# Patient Record
Sex: Female | Born: 1955 | Race: White | Hispanic: No | Smoking: Current every day smoker
Health system: Southern US, Community
[De-identification: ages and names within clinical notes are randomized; demographics above are authoritative.]

## PROBLEM LIST (undated history)

## (undated) DIAGNOSIS — I739 Peripheral vascular disease, unspecified: Secondary | ICD-10-CM

## (undated) DIAGNOSIS — M1990 Unspecified osteoarthritis, unspecified site: Secondary | ICD-10-CM

## (undated) DIAGNOSIS — J449 Chronic obstructive pulmonary disease, unspecified: Secondary | ICD-10-CM

## (undated) DIAGNOSIS — I10 Essential (primary) hypertension: Secondary | ICD-10-CM

## (undated) DIAGNOSIS — B1920 Unspecified viral hepatitis C without hepatic coma: Secondary | ICD-10-CM

## (undated) DIAGNOSIS — E785 Hyperlipidemia, unspecified: Secondary | ICD-10-CM

## (undated) DIAGNOSIS — I219 Acute myocardial infarction, unspecified: Secondary | ICD-10-CM

## (undated) HISTORY — PX: TONSILLECTOMY: SUR1361

## (undated) HISTORY — PX: ABDOMINAL HYSTERECTOMY: SHX81

## (undated) HISTORY — DX: Hyperlipidemia, unspecified: E78.5

## (undated) HISTORY — DX: Essential (primary) hypertension: I10

## (undated) HISTORY — PX: COLON RESECTION: SHX5231

## (undated) HISTORY — DX: Chronic obstructive pulmonary disease, unspecified: J44.9

## (undated) HISTORY — PX: FRACTURE SURGERY: SHX138

## (undated) HISTORY — DX: Peripheral vascular disease, unspecified: I73.9

## (undated) HISTORY — DX: Acute myocardial infarction, unspecified: I21.9

---

## 2002-09-18 HISTORY — PX: MANDIBLE FRACTURE SURGERY: SHX706

## 2002-09-18 HISTORY — PX: HIP FRACTURE SURGERY: SHX118

## 2002-09-18 HISTORY — PX: FACIAL FRACTURE SURGERY: SHX1570

## 2014-09-15 ENCOUNTER — Other Ambulatory Visit: Payer: Self-pay | Admitting: Family Medicine

## 2014-09-15 DIAGNOSIS — R1011 Right upper quadrant pain: Secondary | ICD-10-CM

## 2014-09-21 ENCOUNTER — Ambulatory Visit
Admission: RE | Admit: 2014-09-21 | Discharge: 2014-09-21 | Disposition: A | Discharge status: Home / Self Care | Payer: PRIVATE HEALTH INSURANCE | Source: Ambulatory Visit | Attending: Family Medicine | Admitting: Family Medicine

## 2014-09-21 DIAGNOSIS — R1011 Right upper quadrant pain: Secondary | ICD-10-CM

## 2016-07-28 IMAGING — US US ABDOMEN LIMITED
1 series · 13 of 25 positions shown · non-contrast
Comparison: None.

CLINICAL DATA: Right upper quadrant abdominal pain for 1 day.

EXAM:
US ABDOMEN LIMITED - RIGHT UPPER QUADRANT

[Series 1: us abdomen limited · 0.17mm/px · 13 of 59 slices shown]
[im 1/59]
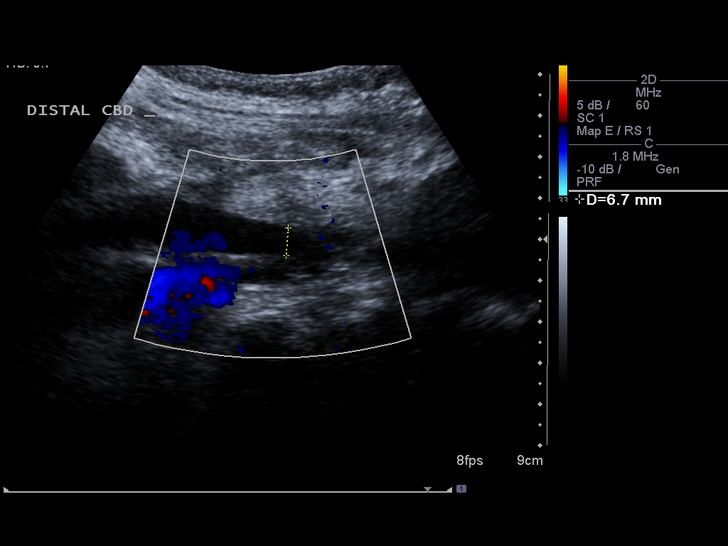
[im 5/59]
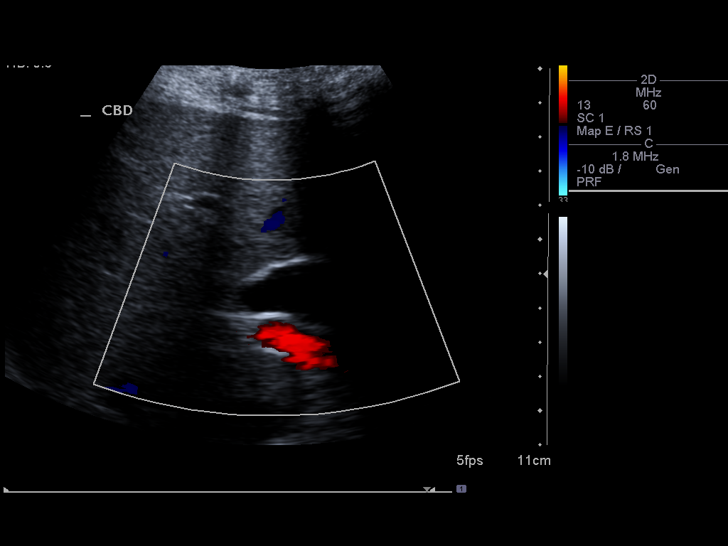
[im 10/59]
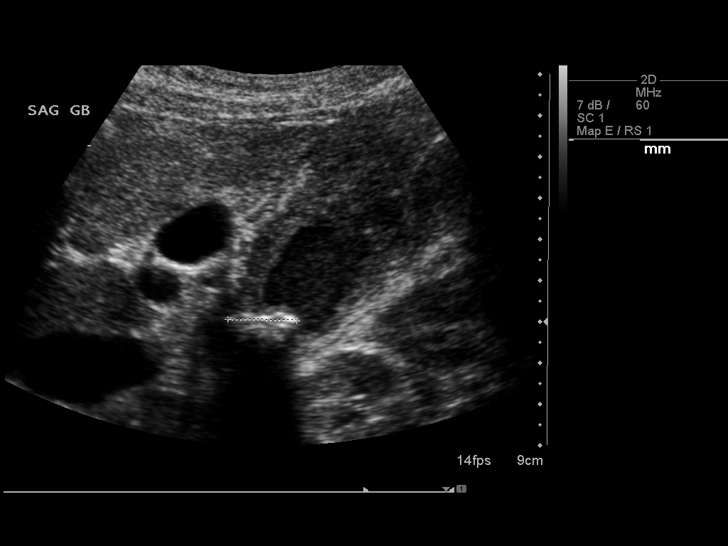
[im 15/59]
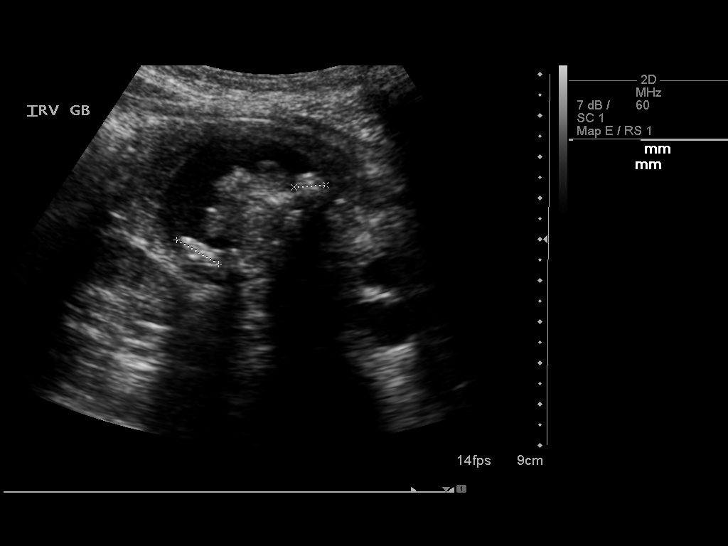
[im 20/59]
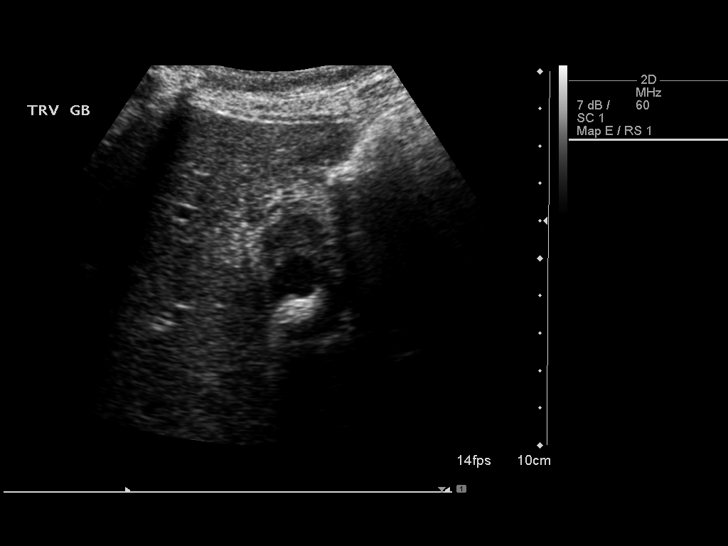
[im 25/59]
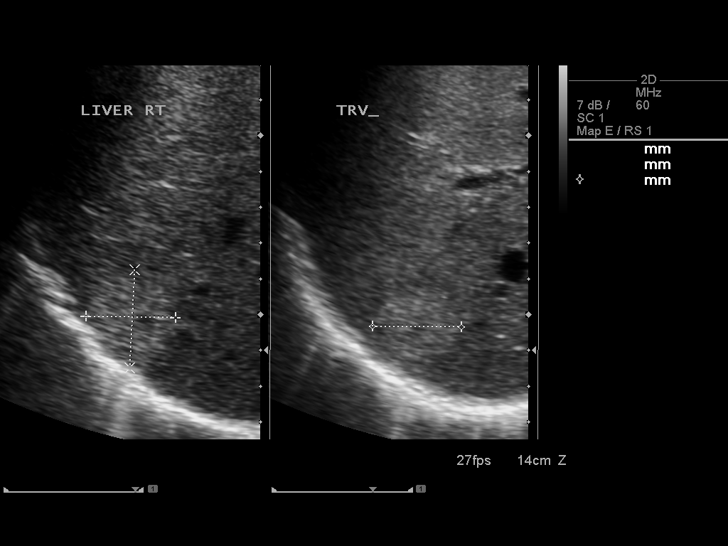
[im 30/59]
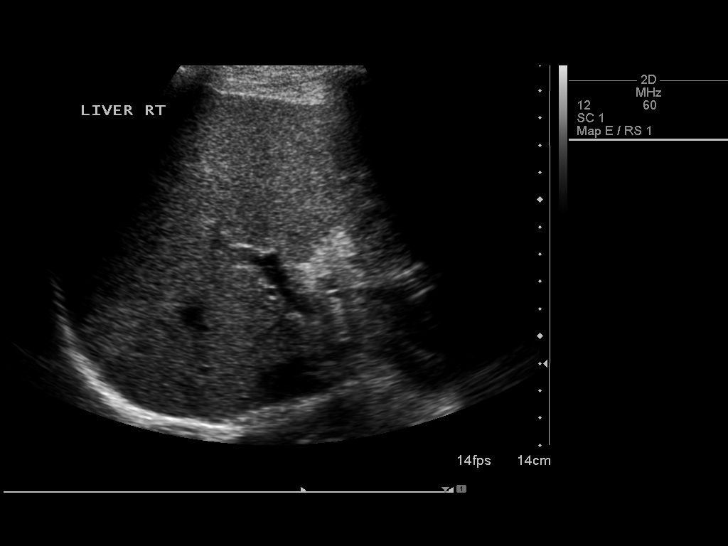
[im 34/59]
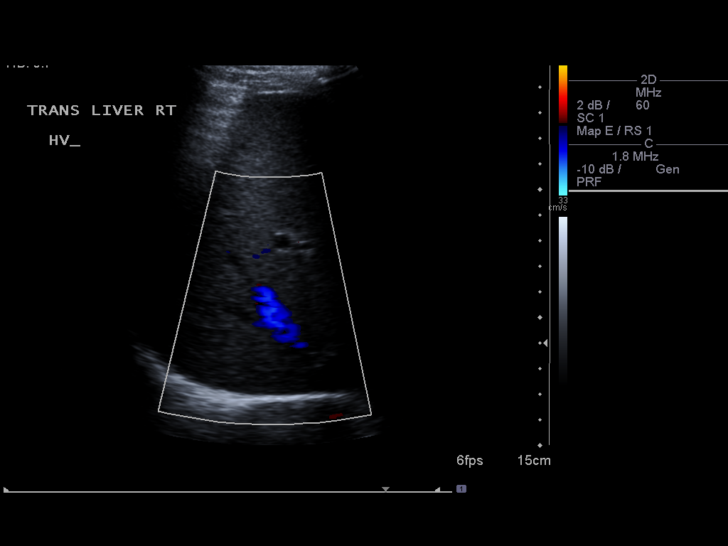
[im 39/59]
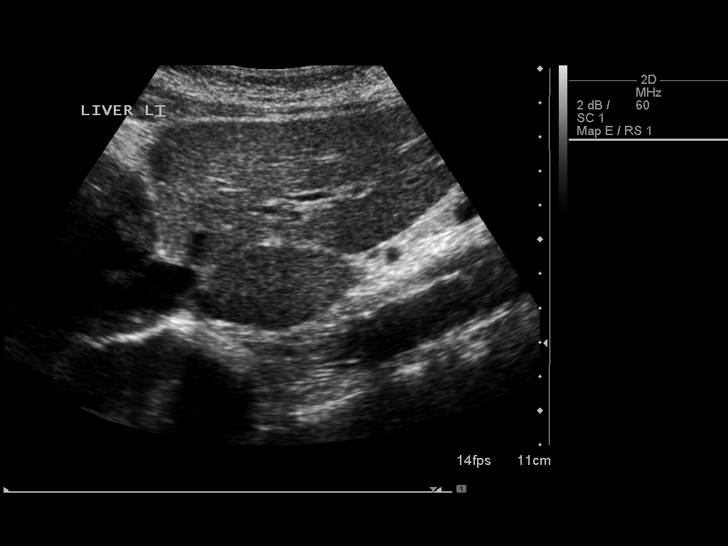
[im 44/59]
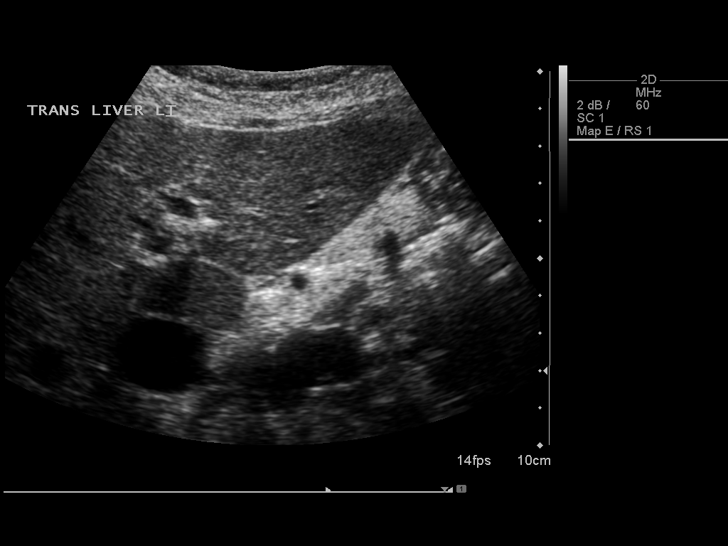
[im 49/59]
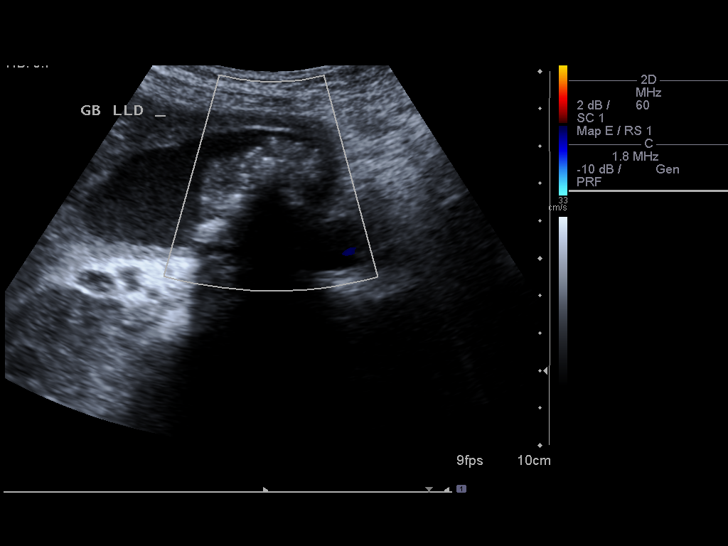
[im 54/59]
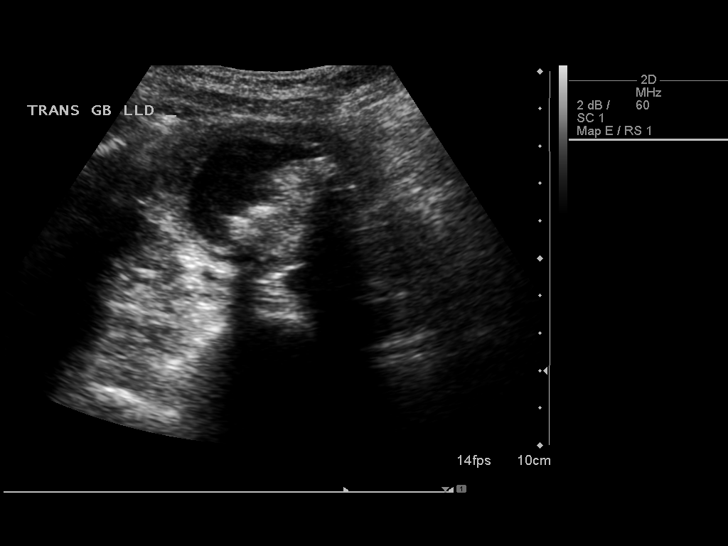
[im 59/59]
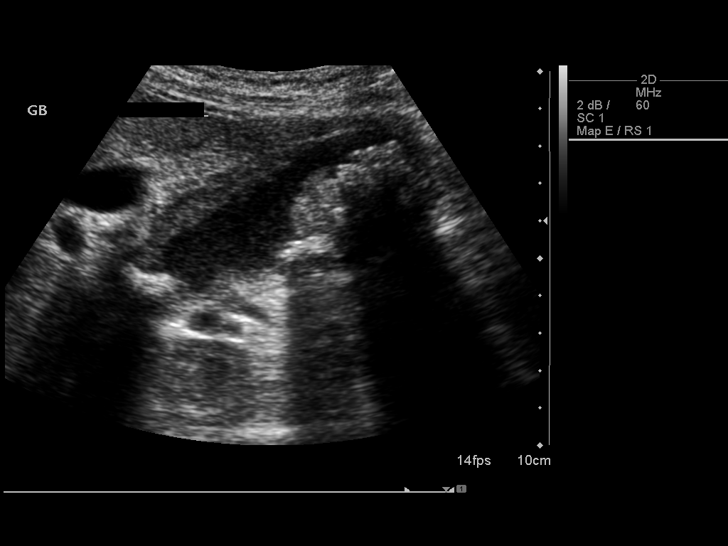

[13 of 25 positions shown; findings below may reference images not displayed]

FINDINGS: Gallbladder:

Multiple gallstones are noted with the largest measuring 1.7 cm in
the neck of the gallbladder. Significant gallbladder wall thickening
is noted measuring 11 mm, but no definite pericholecystic fluid is
noted. No sonographic Murphy's sign is noted.

Common bile duct:

Diameter: Measures 15 mm distally which is concerning for distal
common bile duct obstruction. Correlation with liver function tests
is recommended.

Liver:

2.7 cm echogenic focus is noted peripherally in the right hepatic
lobe most consistent with hemangioma.
IMPRESSION: Cholelithiasis is noted with significant gallbladder wall
thickening, with the largest stone in neck of gallbladder. No
sonographic Murphy's sign is noted, but these findings are
concerning for acute cholecystitis. Clinical correlation and HIDA
scan may be performed for further evaluation.

Dilated distal common bile duct is noted which is concerning for
distal common bile duct obstruction. Correlation with liver function
tests is recommended.

2.7 cm echogenic focus noted peripherally in right hepatic lobe most
consistent with hemangioma in the absence of any history of
malignancy. Followup ultrasound in 6 months is recommended to ensure
stability. If the patient does have a history of malignancy, then
metastatic disease cannot be excluded and further evaluation with
MRI would be recommended in that situation.

These results will be called to the ordering clinician or
representative by the Radiologist Assistant, and communication
documented in the PACS or zVision Dashboard.

## 2016-11-17 ENCOUNTER — Other Ambulatory Visit (HOSPITAL_COMMUNITY): Payer: Self-pay | Admitting: Gastroenterology

## 2016-11-17 DIAGNOSIS — B1920 Unspecified viral hepatitis C without hepatic coma: Secondary | ICD-10-CM

## 2016-12-14 ENCOUNTER — Ambulatory Visit (HOSPITAL_COMMUNITY): Payer: 59

## 2017-01-09 ENCOUNTER — Other Ambulatory Visit (HOSPITAL_COMMUNITY): Payer: PRIVATE HEALTH INSURANCE

## 2017-01-11 ENCOUNTER — Ambulatory Visit (HOSPITAL_COMMUNITY)
Admission: RE | Admit: 2017-01-11 | Discharge: 2017-01-11 | Disposition: A | Discharge status: Home / Self Care | Payer: 59 | Source: Ambulatory Visit | Attending: Gastroenterology | Admitting: Gastroenterology

## 2017-01-11 DIAGNOSIS — K8020 Calculus of gallbladder without cholecystitis without obstruction: Secondary | ICD-10-CM | POA: Insufficient documentation

## 2017-01-11 DIAGNOSIS — K838 Other specified diseases of biliary tract: Secondary | ICD-10-CM | POA: Insufficient documentation

## 2017-01-11 DIAGNOSIS — B1920 Unspecified viral hepatitis C without hepatic coma: Secondary | ICD-10-CM | POA: Diagnosis present

## 2017-01-11 DIAGNOSIS — D1803 Hemangioma of intra-abdominal structures: Secondary | ICD-10-CM | POA: Insufficient documentation

## 2017-07-21 IMAGING — US US ABDOMEN COMPLETE W/ ELASTOGRAPHY
1 series · 13 of 15 positions shown · non-contrast
Comparison: None.

CLINICAL DATA: Hepatitis-C



[Series 1: us abdomen complete w/ elastography · 0.15mm/px · 13 of 15 slices shown]
[im 1/15]
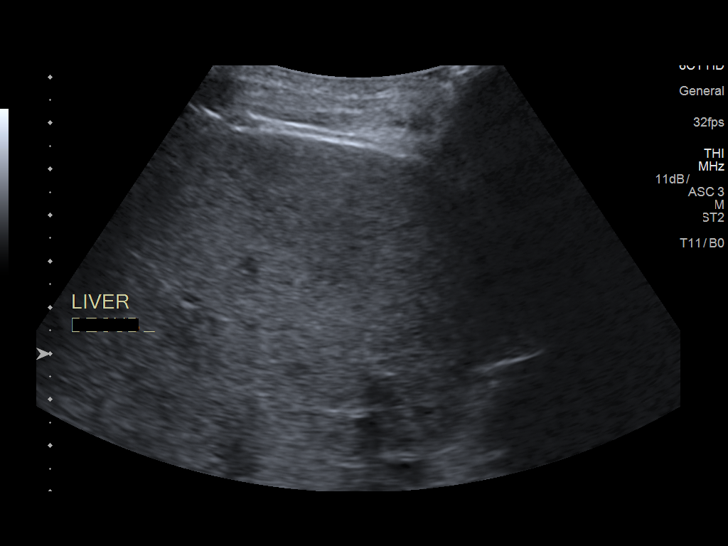
[im 2/15]
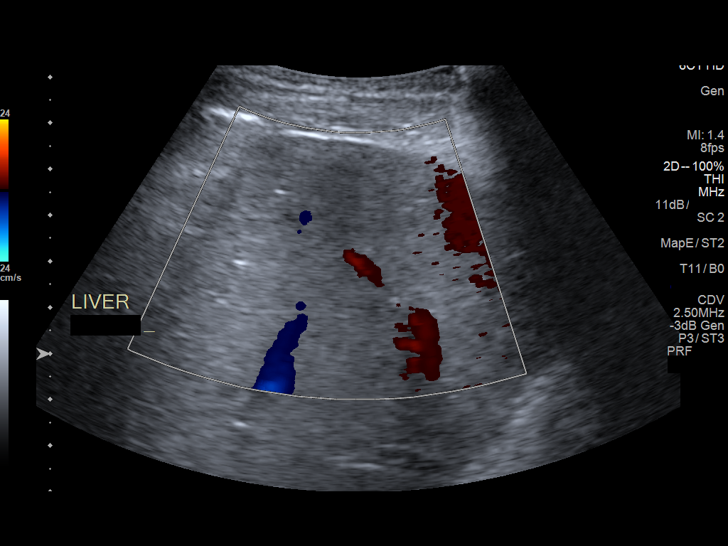
[im 3/15]
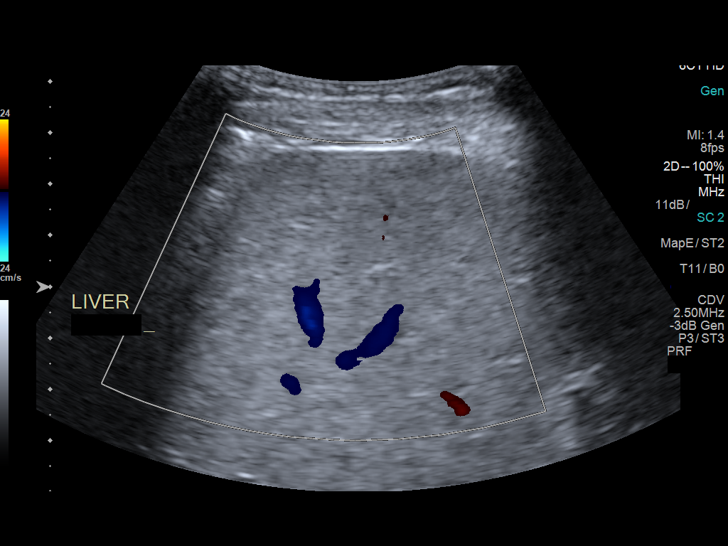
[im 5/15]
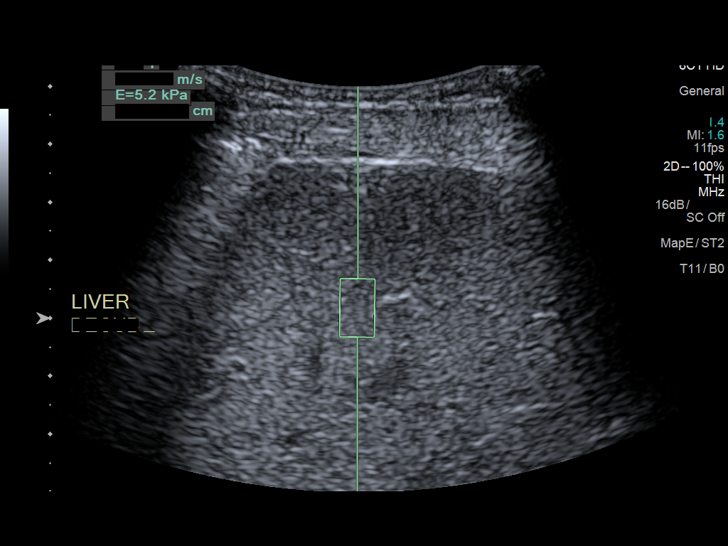
[im 6/15]
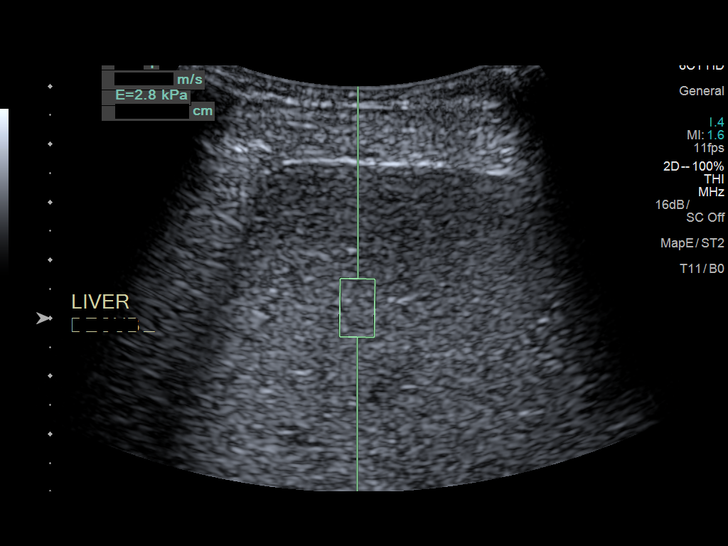
[im 7/15]
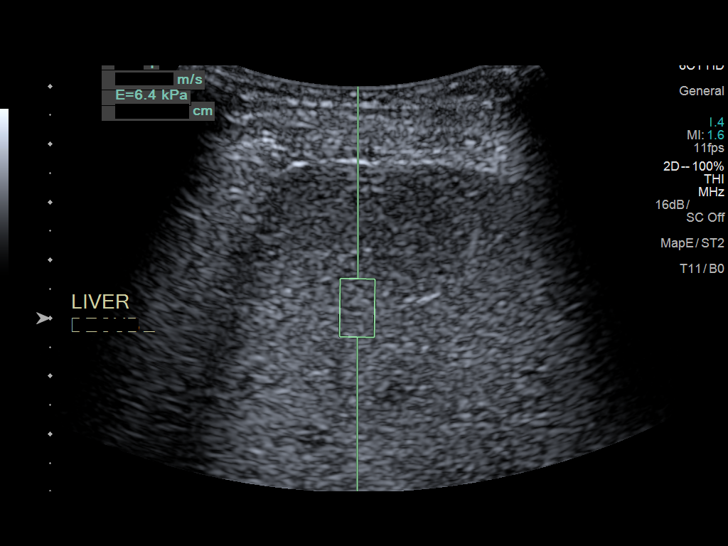
[im 8/15]
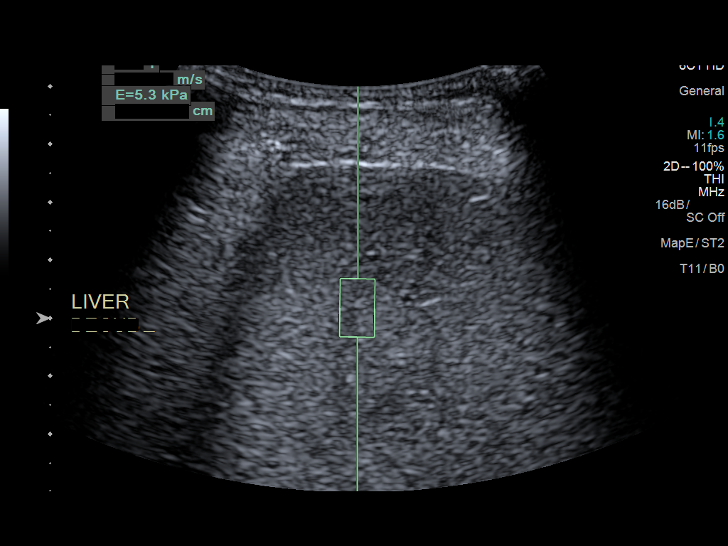
[im 9/15]
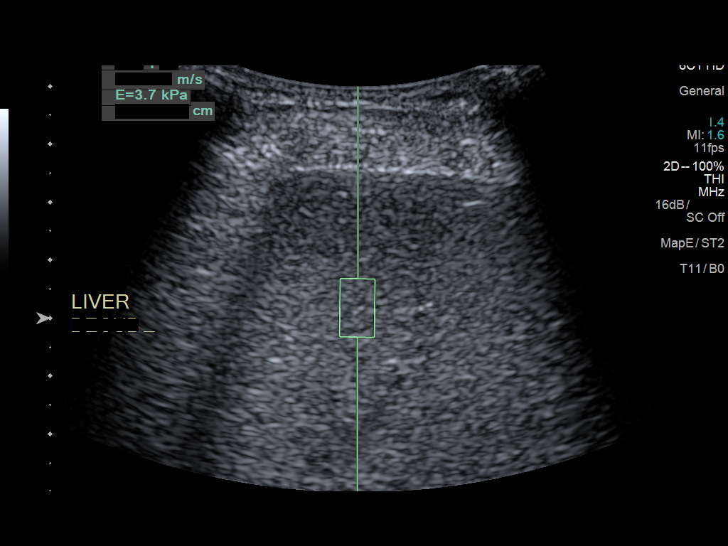
[im 10/15]
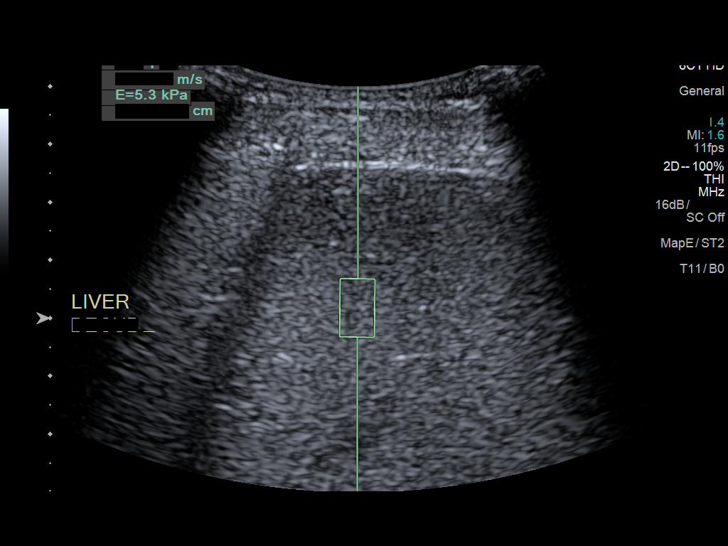
[im 11/15]
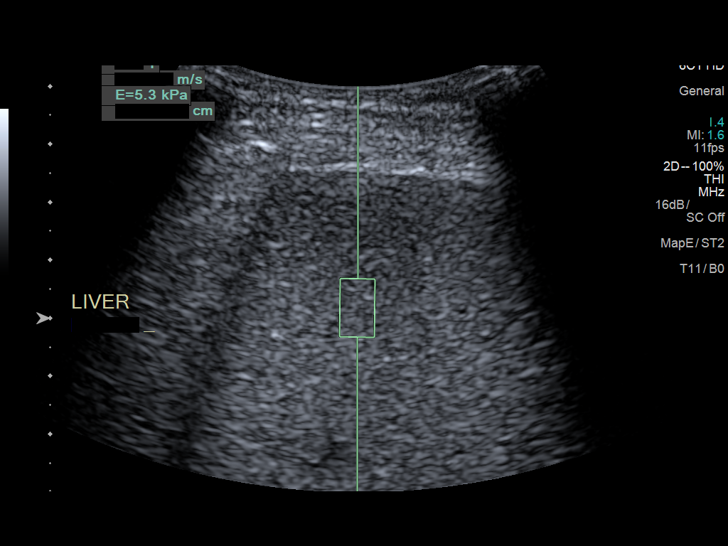
[im 13/15]
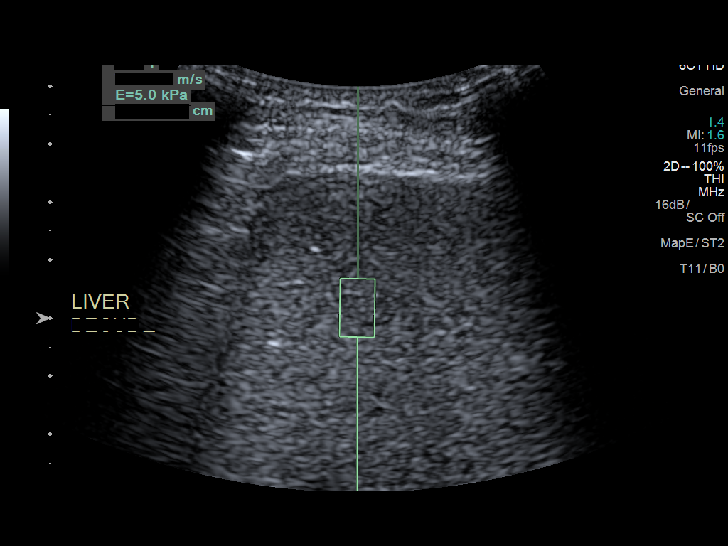
[im 14/15]
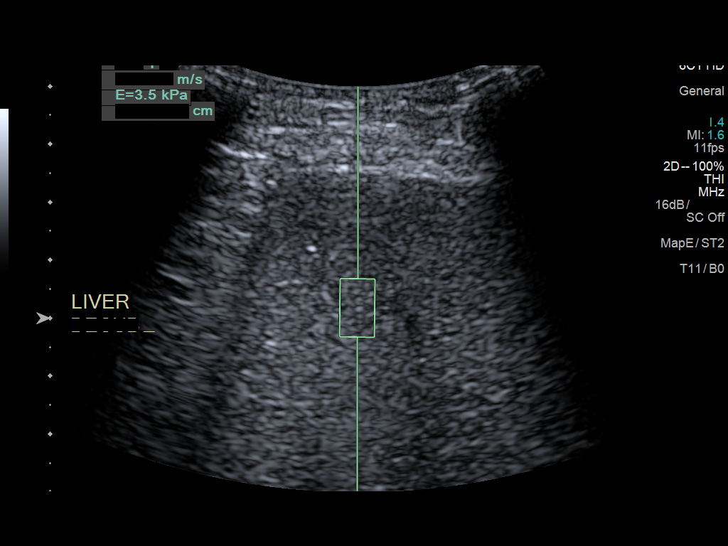
[im 15/15]
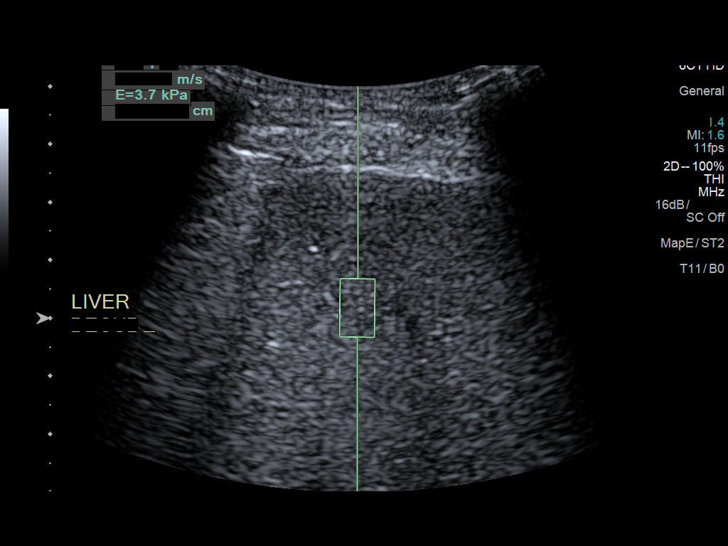

[13 of 15 positions shown; findings below may reference images not displayed]

FINDINGS: ULTRASOUND ABDOMEN

Gallbladder: Numerous gallstones fill the gallbladder. Gallbladder
wall is thickened at 4 mm. Negative sonographic Ofori. This is
unchanged since prior study.

Common bile duct: Diameter: Dilated, 12 mm in greatest diameter. The
distal duct is obscured by overlying bowel gas. This is unchanged
since prior study.

Liver: 2.7 cm echogenic area posteriorly in the liver compatible
with hemangioma, stable.

IVC: No abnormality visualized.

Pancreas: Visualized portion unremarkable.

Spleen: Size and appearance within normal limits.

Right Kidney: Length: 11.2 cm. Echogenicity within normal limits. No
mass or hydronephrosis visualized.

Left Kidney: Length: 12.0 cm. Echogenicity within normal limits. No
mass or hydronephrosis visualized.

Abdominal aorta: No aneurysm visualized.

Other findings: None.

ULTRASOUND HEPATIC ELASTOGRAPHY

Device: Siemens Helix VTQ

Patient position: Left lateral decubitus

Transducer [id number]

Number of measurements: 10

Hepatic segment:  8

Median velocity:   1.31  m/sec

IQR:

IQR/Median velocity ratio:

Corresponding Metavir fibrosis score:  F2 + some F3

Risk of fibrosis: Moderate

Limitations of exam: None

Pertinent findings noted on other imaging exams:  None

Please note that abnormal shear wave velocities may also be
identified in clinical settings other than with hepatic fibrosis,
such as: acute hepatitis, elevated right heart and central venous
pressures including use of beta blockers, Koshy disease
([place]), infiltrative processes such as
mastocytosis/amyloidosis/infiltrative tumor, extrahepatic
cholestasis, in the post-prandial state, and liver transplantation.
Correlation with patient history, laboratory data, and clinical
condition recommended.
IMPRESSION: ULTRASOUND ABDOMEN:
Cholelithiasis with gallbladder wall thickening. The appearance is
similar to prior study. Dilated common bile duct measuring up to 12
mm, also stable since prior study.

Hemangioma in the posterior right hepatic lobe measures 2.7 cm,
stable.

ULTRASOUND HEPATIC ELASTOGRAPHY:

Median hepatic shear wave velocity is calculated at 1.31 m/sec.

Corresponding Metavir fibrosis score is  F2 + some F3.

Risk of fibrosis is Moderate.

Follow-up: Additional testing appropriate

## 2017-10-12 ENCOUNTER — Encounter (HOSPITAL_COMMUNITY): Payer: Self-pay | Admitting: General Practice

## 2017-10-12 ENCOUNTER — Ambulatory Visit: Payer: 59 | Admitting: Cardiovascular Disease

## 2017-10-12 ENCOUNTER — Inpatient Hospital Stay (HOSPITAL_COMMUNITY)
Admission: RE | Disposition: A | Discharge status: Home / Self Care | Payer: Self-pay | Source: Ambulatory Visit | Attending: Cardiovascular Disease

## 2017-10-12 ENCOUNTER — Encounter: Payer: Self-pay | Admitting: Cardiovascular Disease

## 2017-10-12 ENCOUNTER — Other Ambulatory Visit: Payer: Self-pay

## 2017-10-12 ENCOUNTER — Inpatient Hospital Stay (HOSPITAL_COMMUNITY)
Admission: RE | Admit: 2017-10-12 | Discharge: 2017-10-13 | DRG: 247 | Disposition: A | Discharge status: Home / Self Care | Payer: 59 | Source: Ambulatory Visit | Attending: Cardiovascular Disease | Admitting: Cardiovascular Disease

## 2017-10-12 VITALS — BP 136/86 | HR 75 | Ht 64.0 in | Wt 104.0 lb

## 2017-10-12 DIAGNOSIS — Z681 Body mass index (BMI) 19 or less, adult: Secondary | ICD-10-CM

## 2017-10-12 DIAGNOSIS — I10 Essential (primary) hypertension: Secondary | ICD-10-CM | POA: Insufficient documentation

## 2017-10-12 DIAGNOSIS — R636 Underweight: Secondary | ICD-10-CM | POA: Diagnosis present

## 2017-10-12 DIAGNOSIS — I2510 Atherosclerotic heart disease of native coronary artery without angina pectoris: Secondary | ICD-10-CM | POA: Diagnosis present

## 2017-10-12 DIAGNOSIS — S61012D Laceration without foreign body of left thumb without damage to nail, subsequent encounter: Secondary | ICD-10-CM

## 2017-10-12 DIAGNOSIS — I2584 Coronary atherosclerosis due to calcified coronary lesion: Secondary | ICD-10-CM | POA: Diagnosis present

## 2017-10-12 DIAGNOSIS — R072 Precordial pain: Secondary | ICD-10-CM

## 2017-10-12 DIAGNOSIS — Z8249 Family history of ischemic heart disease and other diseases of the circulatory system: Secondary | ICD-10-CM | POA: Diagnosis not present

## 2017-10-12 DIAGNOSIS — W269XXD Contact with unspecified sharp object(s), subsequent encounter: Secondary | ICD-10-CM | POA: Diagnosis not present

## 2017-10-12 DIAGNOSIS — Z79899 Other long term (current) drug therapy: Secondary | ICD-10-CM

## 2017-10-12 DIAGNOSIS — I214 Non-ST elevation (NSTEMI) myocardial infarction: Principal | ICD-10-CM | POA: Diagnosis present

## 2017-10-12 DIAGNOSIS — Z720 Tobacco use: Secondary | ICD-10-CM | POA: Insufficient documentation

## 2017-10-12 DIAGNOSIS — Z716 Tobacco abuse counseling: Secondary | ICD-10-CM

## 2017-10-12 DIAGNOSIS — F17210 Nicotine dependence, cigarettes, uncomplicated: Secondary | ICD-10-CM | POA: Diagnosis present

## 2017-10-12 DIAGNOSIS — E785 Hyperlipidemia, unspecified: Secondary | ICD-10-CM

## 2017-10-12 HISTORY — PX: CORONARY STENT INTERVENTION: CATH118234

## 2017-10-12 HISTORY — DX: Unspecified viral hepatitis C without hepatic coma: B19.20

## 2017-10-12 HISTORY — DX: Hyperlipidemia, unspecified: E78.5

## 2017-10-12 HISTORY — DX: Unspecified osteoarthritis, unspecified site: M19.90

## 2017-10-12 HISTORY — PX: LEFT HEART CATH AND CORONARY ANGIOGRAPHY: CATH118249

## 2017-10-12 HISTORY — PX: CORONARY ATHERECTOMY: CATH118238

## 2017-10-12 HISTORY — PX: INTRAVASCULAR ULTRASOUND/IVUS: CATH118244

## 2017-10-12 HISTORY — PX: CORONARY ANGIOPLASTY WITH STENT PLACEMENT: SHX49

## 2017-10-12 LAB — BASIC METABOLIC PANEL
ANION GAP: 15 (ref 5–15)
BUN: 11 mg/dL (ref 6–20)
CHLORIDE: 101 mmol/L (ref 101–111)
CO2: 22 mmol/L (ref 22–32)
Calcium: 9.4 mg/dL (ref 8.9–10.3)
Creatinine, Ser: 0.83 mg/dL (ref 0.44–1.00)
Glucose, Bld: 94 mg/dL (ref 65–99)
POTASSIUM: 3.5 mmol/L (ref 3.5–5.1)
SODIUM: 138 mmol/L (ref 135–145)

## 2017-10-12 LAB — POCT ACTIVATED CLOTTING TIME
ACTIVATED CLOTTING TIME: 268 s
ACTIVATED CLOTTING TIME: 0 s
Activated Clotting Time: 554 seconds

## 2017-10-12 LAB — CBC
HEMATOCRIT: 44.1 % (ref 36.0–46.0)
HEMOGLOBIN: 14.8 g/dL (ref 12.0–15.0)
MCH: 30.2 pg (ref 26.0–34.0)
MCHC: 33.6 g/dL (ref 30.0–36.0)
MCV: 90 fL (ref 78.0–100.0)
Platelets: 234 10*3/uL (ref 150–400)
RBC: 4.9 MIL/uL (ref 3.87–5.11)
RDW: 12.9 % (ref 11.5–15.5)
WBC: 8.2 10*3/uL (ref 4.0–10.5)

## 2017-10-12 LAB — LIPID PANEL
CHOLESTEROL: 188 mg/dL (ref 0–200)
HDL: 63 mg/dL (ref >40)
LDL Cholesterol: 111 mg/dL — ABNORMAL HIGH (ref 0–99)
Total CHOL/HDL Ratio: 3 RATIO
Triglycerides: 69 mg/dL (ref <150)
VLDL: 14 mg/dL (ref 0–40)

## 2017-10-12 LAB — TROPONIN I
TROPONIN I: 0.53 ng/mL — AB (ref <0.03)
Troponin I: 0.19 ng/mL (ref <0.03)
Troponin I: 0.23 ng/mL (ref <0.03)

## 2017-10-12 LAB — PROTIME-INR
INR: 1.15
Prothrombin Time: 14.7 seconds (ref 11.4–15.2)

## 2017-10-12 SURGERY — LEFT HEART CATH AND CORONARY ANGIOGRAPHY
Anesthesia: LOCAL

## 2017-10-12 MED ORDER — LIDOCAINE HCL (PF) 1 % IJ SOLN
INTRAMUSCULAR | Status: DC | PRN
  Administered 2017-10-12: 2 mL

## 2017-10-12 MED ORDER — MIDAZOLAM HCL 2 MG/2ML IJ SOLN
INTRAMUSCULAR | Status: DC | PRN
  Administered 2017-10-12 (×2): 1 mg via INTRAVENOUS

## 2017-10-12 MED ORDER — HEPARIN (PORCINE) IN NACL 2-0.9 UNIT/ML-% IJ SOLN
INTRAMUSCULAR | Status: AC
  Filled 2017-10-12: qty 500

## 2017-10-12 MED ORDER — SODIUM CHLORIDE 0.9% FLUSH: 3.0000 mL | INTRAVENOUS | Status: DC | PRN

## 2017-10-12 MED ORDER — ACTIVE PARTNERSHIP FOR HEALTH OF YOUR HEART BOOK
Freq: Once | Status: DC
  Filled 2017-10-12 (×2): qty 1

## 2017-10-12 MED ORDER — DOPAMINE-DEXTROSE 3.2-5 MG/ML-% IV SOLN
INTRAVENOUS | Status: AC
  Filled 2017-10-12: qty 250

## 2017-10-12 MED ORDER — FENTANYL CITRATE (PF) 100 MCG/2ML IJ SOLN
INTRAMUSCULAR | Status: AC
  Filled 2017-10-12: qty 2

## 2017-10-12 MED ORDER — TICAGRELOR 90 MG PO TABS
ORAL_TABLET | ORAL | Status: DC | PRN
  Administered 2017-10-12: 180 mg via ORAL

## 2017-10-12 MED ORDER — ATROPINE SULFATE 1 MG/10ML IJ SOSY
PREFILLED_SYRINGE | INTRAMUSCULAR | Status: DC | PRN
  Administered 2017-10-12: 0.5 mg via INTRAVENOUS

## 2017-10-12 MED ORDER — ACETAMINOPHEN 325 MG PO TABS: 650.0000 mg | ORAL_TABLET | ORAL | Status: DC | PRN

## 2017-10-12 MED ORDER — THE SENSUOUS HEART BOOK
Freq: Once | Status: AC
  Administered 2017-10-13
  Filled 2017-10-12: qty 1

## 2017-10-12 MED ORDER — LISINOPRIL 10 MG PO TABS: 10.0000 mg | ORAL_TABLET | Freq: Every day | ORAL | Status: DC

## 2017-10-12 MED ORDER — NITROGLYCERIN IN D5W 200-5 MCG/ML-% IV SOLN
INTRAVENOUS | Status: AC
  Filled 2017-10-12: qty 250

## 2017-10-12 MED ORDER — ASPIRIN 81 MG PO CHEW
81.0000 mg | CHEWABLE_TABLET | ORAL | Status: AC
  Administered 2017-10-12: 81 mg via ORAL

## 2017-10-12 MED ORDER — ONDANSETRON HCL 4 MG/2ML IJ SOLN
INTRAMUSCULAR | Status: DC | PRN
  Administered 2017-10-12: 4 mg via INTRAVENOUS

## 2017-10-12 MED ORDER — HEPARIN SODIUM (PORCINE) 1000 UNIT/ML IJ SOLN
INTRAMUSCULAR | Status: DC | PRN
  Administered 2017-10-12: 16:00:00 via INTRACORONARY

## 2017-10-12 MED ORDER — HYDRALAZINE HCL 20 MG/ML IJ SOLN: 5.0000 mg | INTRAMUSCULAR | Status: AC | PRN

## 2017-10-12 MED ORDER — FENTANYL CITRATE (PF) 100 MCG/2ML IJ SOLN
INTRAMUSCULAR | Status: DC | PRN
  Administered 2017-10-12 (×2): 25 ug via INTRAVENOUS

## 2017-10-12 MED ORDER — SODIUM CHLORIDE 0.9 % IV SOLN
INTRAVENOUS | Status: DC
  Administered 2017-10-12 (×2): 250 mL via INTRAVENOUS
  Administered 2017-10-12: 14:00:00 via INTRAVENOUS

## 2017-10-12 MED ORDER — TICAGRELOR 90 MG PO TABS
90.0000 mg | ORAL_TABLET | Freq: Two times a day (BID) | ORAL | Status: DC
  Administered 2017-10-13: 90 mg via ORAL
  Filled 2017-10-12: qty 1

## 2017-10-12 MED ORDER — IOPAMIDOL (ISOVUE-370) INJECTION 76%
INTRAVENOUS | Status: AC
  Filled 2017-10-12: qty 50

## 2017-10-12 MED ORDER — GABAPENTIN 100 MG PO CAPS
100.0000 mg | ORAL_CAPSULE | Freq: Three times a day (TID) | ORAL | Status: DC
  Administered 2017-10-12 – 2017-10-13 (×2): 100 mg via ORAL
  Filled 2017-10-12 (×2): qty 1

## 2017-10-12 MED ORDER — MIDAZOLAM HCL 2 MG/2ML IJ SOLN
INTRAMUSCULAR | Status: AC
  Filled 2017-10-12: qty 2

## 2017-10-12 MED ORDER — VERAPAMIL HCL 2.5 MG/ML IV SOLN
INTRAVENOUS | Status: DC | PRN
  Administered 2017-10-12: 10 mL via INTRA_ARTERIAL

## 2017-10-12 MED ORDER — SODIUM CHLORIDE 0.9 % IV SOLN
INTRAVENOUS | Status: AC
  Administered 2017-10-12: 20:00:00 via INTRAVENOUS

## 2017-10-12 MED ORDER — IOPAMIDOL (ISOVUE-370) INJECTION 76%
INTRAVENOUS | Status: DC | PRN
  Administered 2017-10-12: 150 mL

## 2017-10-12 MED ORDER — ANGIOPLASTY BOOK
Freq: Once | Status: AC
  Administered 2017-10-13
  Filled 2017-10-12 (×2): qty 1

## 2017-10-12 MED ORDER — VERAPAMIL HCL 2.5 MG/ML IV SOLN
INTRAVENOUS | Status: AC
  Filled 2017-10-12: qty 2

## 2017-10-12 MED ORDER — HEPARIN SODIUM (PORCINE) 1000 UNIT/ML IJ SOLN
INTRAMUSCULAR | Status: DC | PRN
  Administered 2017-10-12: 3000 [IU] via INTRAVENOUS
  Administered 2017-10-12: 4000 [IU] via INTRAVENOUS
  Administered 2017-10-12: 3000 [IU] via INTRAVENOUS

## 2017-10-12 MED ORDER — SODIUM CHLORIDE 0.9% FLUSH
3.0000 mL | Freq: Two times a day (BID) | INTRAVENOUS | Status: DC
  Administered 2017-10-12: 3 mL via INTRAVENOUS

## 2017-10-12 MED ORDER — ASPIRIN 81 MG PO CHEW
CHEWABLE_TABLET | ORAL | Status: AC
  Administered 2017-10-12: 81 mg via ORAL
  Filled 2017-10-12: qty 1

## 2017-10-12 MED ORDER — TICAGRELOR 90 MG PO TABS
ORAL_TABLET | ORAL | Status: AC
  Filled 2017-10-12: qty 2

## 2017-10-12 MED ORDER — LABETALOL HCL 5 MG/ML IV SOLN: 10.0000 mg | INTRAVENOUS | Status: AC | PRN

## 2017-10-12 MED ORDER — SODIUM CHLORIDE 0.9 % IV SOLN: 250.0000 mL | INTRAVENOUS | Status: DC | PRN

## 2017-10-12 MED ORDER — DOPAMINE-DEXTROSE 3.2-5 MG/ML-% IV SOLN
INTRAVENOUS | Status: DC | PRN
  Administered 2017-10-12: 5 ug/kg/min via INTRAVENOUS

## 2017-10-12 MED ORDER — HEPARIN (PORCINE) IN NACL 2-0.9 UNIT/ML-% IJ SOLN
INTRAMUSCULAR | Status: DC | PRN
  Administered 2017-10-12: 15:00:00

## 2017-10-12 MED ORDER — DOXYCYCLINE HYCLATE 100 MG PO CAPS: 100.0000 mg | ORAL_CAPSULE | Freq: Two times a day (BID) | ORAL | Status: DC

## 2017-10-12 MED ORDER — ONDANSETRON HCL 4 MG/2ML IJ SOLN
INTRAMUSCULAR | Status: AC
  Filled 2017-10-12: qty 2

## 2017-10-12 MED ORDER — ASPIRIN 81 MG PO CHEW
81.0000 mg | CHEWABLE_TABLET | Freq: Every day | ORAL | Status: DC
  Administered 2017-10-13: 09:00:00 81 mg via ORAL
  Filled 2017-10-12: qty 1

## 2017-10-12 MED ORDER — ONDANSETRON HCL 4 MG/2ML IJ SOLN: 4.0000 mg | Freq: Four times a day (QID) | INTRAMUSCULAR | Status: DC | PRN

## 2017-10-12 MED ORDER — NITROGLYCERIN 1 MG/10 ML FOR IR/CATH LAB
INTRA_ARTERIAL | Status: AC
  Filled 2017-10-12: qty 10

## 2017-10-12 MED ORDER — IOPAMIDOL (ISOVUE-370) INJECTION 76%
INTRAVENOUS | Status: AC
  Filled 2017-10-12: qty 200

## 2017-10-12 MED ORDER — LIDOCAINE HCL 1 % IJ SOLN
INTRAMUSCULAR | Status: AC
  Filled 2017-10-12: qty 20

## 2017-10-12 MED ORDER — ATORVASTATIN CALCIUM 80 MG PO TABS
80.0000 mg | ORAL_TABLET | Freq: Every day | ORAL | Status: DC
  Administered 2017-10-12: 20:00:00 80 mg via ORAL
  Filled 2017-10-12: qty 1

## 2017-10-12 MED ORDER — HEPARIN SODIUM (PORCINE) 1000 UNIT/ML IJ SOLN
INTRAMUSCULAR | Status: AC
  Filled 2017-10-12: qty 1

## 2017-10-12 MED ORDER — ATROPINE SULFATE 1 MG/10ML IJ SOSY
PREFILLED_SYRINGE | INTRAMUSCULAR | Status: AC
Start: 2017-10-12 — End: 2017-10-12
  Filled 2017-10-12: qty 10

## 2017-10-12 SURGICAL SUPPLY — 29 items
BALLN SAPPHIRE 2.5X20 (BALLOONS) ×2
BALLN WOLVERINE 2.50X10 (BALLOONS) ×2
BALLN ~~LOC~~ EMERGE MR 3.25X15 (BALLOONS) ×2
BALLOON SAPPHIRE 2.5X20 (BALLOONS) IMPLANT
BALLOON WOLVERINE 2.50X10 (BALLOONS) IMPLANT
BALLOON ~~LOC~~ EMERGE MR 3.25X15 (BALLOONS) IMPLANT
BUR ROTAPRO CNCT ADVNCR 1.5 (BURR) IMPLANT
BURR ROTAPRO CNCT ADVNCR 1.5 (BURR) ×2
CATH 5FR JL3.5 JR4 ANG PIG MP (CATHETERS) ×1 IMPLANT
CATH LAUNCHER 6FR AL1 (CATHETERS) IMPLANT
CATH OPTICROSS 40MHZ (CATHETERS) ×1 IMPLANT
CATHETER LAUNCHER 6FR AL1 (CATHETERS) ×2
DEVICE RAD COMP TR BAND LRG (VASCULAR PRODUCTS) ×1 IMPLANT
ELECT DEFIB PAD ADLT CADENCE (PAD) ×1 IMPLANT
GLIDESHEATH SLEND SS 6F .021 (SHEATH) ×1 IMPLANT
GUIDEWIRE INQWIRE 1.5J.035X260 (WIRE) IMPLANT
INQWIRE 1.5J .035X260CM (WIRE) ×2
KIT ENCORE 26 ADVANTAGE (KITS) ×1 IMPLANT
KIT HEART LEFT (KITS) ×2 IMPLANT
KIT HEMO VALVE WATCHDOG (MISCELLANEOUS) ×1 IMPLANT
LUBRICANT ROTAGLIDE 20CC VIAL (MISCELLANEOUS) ×1 IMPLANT
PACK CARDIAC CATHETERIZATION (CUSTOM PROCEDURE TRAY) ×2 IMPLANT
SLED PULL BACK IVUS (MISCELLANEOUS) ×1 IMPLANT
STENT SYNERGY DES 3X32 (Permanent Stent) ×1 IMPLANT
SYR MEDRAD MARK V 150ML (SYRINGE) ×2 IMPLANT
TRANSDUCER W/STOPCOCK (MISCELLANEOUS) ×2 IMPLANT
TUBING CIL FLEX 10 FLL-RA (TUBING) ×2 IMPLANT
WIRE COUGAR XT STRL 190CM (WIRE) ×2 IMPLANT
WIRE ROTA FLOPPY .009X325CM (WIRE) ×1 IMPLANT

## 2017-10-12 NOTE — Progress Notes (Signed)
TR BAND REMOVAL  LOCATION:    right radial  DEFLATED PER PROTOCOL:    Yes.    TIME BAND OFF / DRESSING APPLIED:    2030   SITE UPON ARRIVAL:    Level 0  SITE AFTER BAND REMOVAL:    Level 0  CIRCULATION SENSATION AND MOVEMENT:    Within Normal Limits   Yes.    COMMENTS:   Tolerated procedure well

## 2017-10-12 NOTE — Interval H&P Note (Signed)
History and Physical Interval Note:  10/12/2017 2:27 PM  Mindy Gould  has presented today for cardiac cath with the diagnosis of unstable angina.The various methods of treatment have been discussed with the patient and family. After consideration of risks, benefits and other options for treatment, the patient has consented to  Procedure(s): LEFT HEART CATH AND CORONARY ANGIOGRAPHY (N/A) as a surgical intervention .  The patient's history has been reviewed, patient examined, no change in status, stable for surgery.  I have reviewed the patient's chart and labs.  Questions were answered to the patient's satisfaction.    Cath Lab Visit (complete for each Cath Lab visit)  Clinical Evaluation Leading to the Procedure:   ACS: Yes.    Non-ACS:    Anginal Classification: CCS III  Anti-ischemic medical therapy: No Therapy  Non-Invasive Test Results: No non-invasive testing performed  Prior CABG: No previous CABG        [name]

## 2017-10-12 NOTE — Patient Instructions (Signed)
You have been scheduled for a cardiac catheterization today. Please head to the admissions office at [facility]

## 2017-10-12 NOTE — Progress Notes (Addendum)
Lab called with critical value: 0.19, cath lab was called, Sherlyn Lick was informed and pt is to go to the cath lab now. Pt denies cp currently.
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
## 2017-10-13 ENCOUNTER — Inpatient Hospital Stay (HOSPITAL_COMMUNITY): Payer: 59

## 2017-10-13 LAB — CBC
HEMATOCRIT: 41.1 % (ref 36.0–46.0)
HEMOGLOBIN: 13.6 g/dL (ref 12.0–15.0)
MCH: 29.9 pg (ref 26.0–34.0)
MCHC: 33.1 g/dL (ref 30.0–36.0)
MCV: 90.3 fL (ref 78.0–100.0)
Platelets: 191 10*3/uL (ref 150–400)
RBC: 4.55 MIL/uL (ref 3.87–5.11)
RDW: 12.7 % (ref 11.5–15.5)
WBC: 8.9 10*3/uL (ref 4.0–10.5)

## 2017-10-13 LAB — BASIC METABOLIC PANEL
ANION GAP: 12 (ref 5–15)
BUN: 12 mg/dL (ref 6–20)
CHLORIDE: 106 mmol/L (ref 101–111)
CO2: 22 mmol/L (ref 22–32)
Calcium: 8.8 mg/dL — ABNORMAL LOW (ref 8.9–10.3)
Creatinine, Ser: 0.81 mg/dL (ref 0.44–1.00)
GFR calc Af Amer: >60 mL/min (ref >60)
GFR calc non Af Amer: >60 mL/min (ref >60)
GLUCOSE: 80 mg/dL (ref 65–99)
POTASSIUM: 3.7 mmol/L (ref 3.5–5.1)
Sodium: 140 mmol/L (ref 135–145)

## 2017-10-13 LAB — TROPONIN I: TROPONIN I: 0.49 ng/mL — AB (ref <0.03)

## 2017-10-13 MED ORDER — ATORVASTATIN CALCIUM 80 MG PO TABS: 80.0000 mg | ORAL_TABLET | Freq: Every day | ORAL | 5 refills | Status: DC

## 2017-10-13 MED ORDER — METOPROLOL TARTRATE 12.5 MG HALF TABLET
12.5000 mg | ORAL_TABLET | Freq: Two times a day (BID) | ORAL | Status: DC
  Administered 2017-10-13: 09:00:00 12.5 mg via ORAL
  Filled 2017-10-13: qty 1

## 2017-10-13 MED ORDER — METOPROLOL TARTRATE 25 MG PO TABS: 12.5000 mg | ORAL_TABLET | Freq: Two times a day (BID) | ORAL | 5 refills | Status: DC

## 2017-10-13 MED ORDER — TICAGRELOR 90 MG PO TABS: 90.0000 mg | ORAL_TABLET | Freq: Two times a day (BID) | ORAL | 0 refills | Status: DC

## 2017-10-13 MED ORDER — TICAGRELOR 90 MG PO TABS: 90.0000 mg | ORAL_TABLET | Freq: Two times a day (BID) | ORAL | 3 refills | Status: DC

## 2017-10-13 NOTE — Care Management Note (Addendum)
Case Management Note  Patient Details  Name: Mindy Gould MRN: [id number] Date of Birth: January 02, 1956  Subjective/Objective:    Form home, pta indep, s/p coronary stent intervention,will be on brilinta, NCM gave patient the 30 day savings card and the $5 co pay card.  PA will send script to pharmacy to see how much it will be for patient, pharmacy does not open til 9 am.  NCM will call [facility] in [facility]  Per pharmacy brilinta is coming up for 24.99.  NCM informed Anderson Malta the [employer].                 Action/Plan: DC home today.  Expected Discharge Date:                  Expected Discharge Plan:  Home/Self Care  In-House Referral:     Discharge planning Services  CM Consult  Post Acute Care Choice:    Choice offered to:     DME Arranged:    DME Agency:     HH Arranged:    [place] Agency:     Status of Service:  Completed, signed off  If discussed at [employer] of Stay Meetings, dates discussed:    Additional Comments:  Zenon Mayo, RN 10/13/2017, 8:22 AM

## 2017-10-13 NOTE — Discharge Summary (Signed)
Discharge Summary    Patient ID: Mindy Gould,  MRN: [id number], DOB/AGE: December 04, 1955 62 y.o.  Admit date: 10/12/2017 Discharge date: 10/13/2017  Primary Care Provider: Hayden Rasmussen Primary Cardiologist: Dr. Sallyanne Kuster  Discharge Diagnoses    Principal Problem:   NSTEMI (non-ST elevated myocardial infarction) [facility]) Active Problems:   Essential hypertension   Tobacco abuse   History of Present Illness     Mindy Gould is a 62 y.o. with past medical history of HTN and tobacco use who presented to the office on 10/12/2017 for evaluation of chest pain which started the week prior. She developed recurrent symptoms two days prior to her visit which radiated to her jaw and neck. Labs had been obtained by her PCP the day prior and showed an elevated Troponin and mildly elevated CK-MB, therefore it was recommended she proceed to [facility] for a cardiac catheterization.   Hospital Course     Consultants: None  Her cardiac catheterization showed severe single vessel CAD with 30% proximal RCA stenosis and 70-80% mid-RCA stenosis. She underwent successful rotational atherectomy with PTCA/DESx1 to the mid-RCA. There was 90% stenosis along the PL branch which was felt to be too small for PCI.  She will be on DAPT with ASA and Brilinta for 1 year.   The following morning, she denied any recurrent chest pain or dyspnea. Cyclic troponin values peaked at 0.53. Post-procedure Hgb and creatinine remained stable. She was started on Lopressor 12.5mg  BID along with Atorvastatin 80mg  daily. Smoking cessation was strongly advised.   She was last examined by Dr. Stanford Breed and deemed stable for discharge. Close outpatient follow-up has been arranged.   _____________  Discharge Vitals Blood pressure 120/62, pulse 71, temperature 98 F (36.7 C), temperature source Oral, resp. rate 16, height 5' 4.5" (1.638 m), weight 109 lb 9.1 oz (49.7 kg), SpO2 97 %.  Filed Weights   10/12/17 1246 10/13/17 0415    Weight: 104 lb (47.2 kg) 109 lb 9.1 oz (49.7 kg)    Labs & Radiologic Studies     CBC Recent Labs    10/12/17 1253 10/13/17 0623  WBC 8.2 8.9  HGB 14.8 13.6  HCT 44.1 41.1  MCV 90.0 90.3  PLT 234 782   Basic Metabolic Panel Recent Labs    10/12/17 1253 10/13/17 0623  NA 138 140  K 3.5 3.7  CL 101 106  CO2 22 22  GLUCOSE 94 80  BUN 11 12  CREATININE 0.83 0.81  CALCIUM 9.4 8.8*   Liver Function Tests No results for input(s): AST, ALT, ALKPHOS, BILITOT, PROT, ALBUMIN in the last 72 hours. No results for input(s): LIPASE, AMYLASE in the last 72 hours. Cardiac Enzymes Recent Labs    10/12/17 1729 10/12/17 2235 10/13/17 0623  TROPONINI 0.23* 0.53* 0.49*   BNP Invalid input(s): POCBNP D-Dimer No results for input(s): DDIMER in the last 72 hours. Hemoglobin A1C No results for input(s): HGBA1C in the last 72 hours. Fasting Lipid Panel Recent Labs    10/12/17 1320  CHOL 188  HDL 63  LDLCALC 111*  TRIG 69  CHOLHDL 3.0   Thyroid Function Tests No results for input(s): TSH, T4TOTAL, T3FREE, THYROIDAB in the last 72 hours.  Invalid input(s): FREET3  No results found.   Diagnostic Studies/Procedures     Cardiac Catheterization:  1. Severe single vessel CAD/NSTEMI 2. The RCA is a heavily calcified vessel throughout the proximal, mid and distal segments. The proximal vessel has diffuse mild disease (  proven by IVUS). The mid vessel has two hazy, severe stenoses that are heavily calcified (proven by IVUS). The distal branches are relatively small in caliber. The posterolateral branch is 1.7 mm vessel with 90% stenosis (felt to be too small for PCI).  3. Successful rotational atherectomy, PTCA/DES x 1 mid RCA 4. Mild non-obstructive disease in the LAD and Circumflex  5. Hyperdynamic LV systolic function  Recommendations: She will need one year of DAPT with ASA and Brilinta. I will start a high dose statin. Start beta blocker tomorrow if heart rate and BP are  stable.   Disposition   Pt is being discharged home today in good condition.  Follow-up Plans & Appointments    Follow-up Information    Barrett, Evelene Croon, PA-C Follow up on 10/29/2017.   Specialties:  Cardiology, Radiology Why:  Cardiology Follow-Up on 10/29/2017 at 2:30PM with [name] (Dr. Victorino December PA).  Contact information: [address] [place] [address] [phone]          Discharge Instructions    Diet - low sodium heart healthy   Complete by:  As directed    Discharge instructions   Complete by:  As directed    PLEASE REMEMBER TO BRING ALL OF YOUR MEDICATIONS TO EACH OF YOUR FOLLOW-UP OFFICE VISITS.  PLEASE ATTEND ALL SCHEDULED FOLLOW-UP APPOINTMENTS.   Activity: Increase activity slowly as tolerated. You may shower, but no soaking baths (or swimming) for 1 week. No driving for 24 hours. No lifting over 5 lbs for 1 week. No sexual activity for 1 week.   You May Return to Work: in 1 week (if applicable)  Wound Care: You may wash cath site gently with soap and water. Keep cath site clean and dry. If you notice pain, swelling, bleeding or pus at your cath site, please call [phone].   Increase activity slowly   Complete by:  As directed       Discharge Medications     Medication List    STOP taking these medications   lisinopril 10 MG tablet Commonly known as:  PRINIVIL,ZESTRIL     TAKE these medications   aspirin EC 81 MG tablet Take 81 mg by mouth daily.   atorvastatin 80 MG tablet Commonly known as:  LIPITOR Take 1 tablet (80 mg total) by mouth daily at 6 PM.   gabapentin 100 MG capsule Commonly known as:  NEURONTIN Take 100 mg by mouth 3 (three) times daily.   HYDROcodone-acetaminophen 5-325 MG tablet Commonly known as:  NORCO/VICODIN Take 1 tablet by mouth every 6 (six) hours as needed for moderate pain.   metoprolol tartrate 25 MG tablet Commonly known as:  LOPRESSOR Take 0.5 tablets (12.5 mg total) by mouth  2 (two) times daily.   ticagrelor 90 MG Tabs tablet Commonly known as:  BRILINTA Take 1 tablet (90 mg total) by mouth 2 (two) times daily.   traMADol 50 MG tablet Commonly known as:  ULTRAM Take by mouth every 6 (six) hours as needed.       Aspirin prescribed at discharge?  Yes High Intensity Statin Prescribed? (Lipitor 40-80mg  or Crestor 20-40mg ): Yes Beta Blocker Prescribed? Yes For EF 40% or less, Was ACEI/ARB Prescribed? No: EF > 40% ADP Receptor Inhibitor Prescribed? (i.e. Plavix etc.-Includes Medically Managed Patients): Yes For EF <40%, Aldosterone Inhibitor Prescribed? No: EF > 40% Was EF assessed during THIS hospitalization? Yes Was Cardiac Rehab II ordered? (Included Medically managed Patients): Yes   Allergies Allergies  Allergen Reactions  .  Benadryl [Diphenhydramine] Nausea Only and Swelling     Outstanding Labs/Studies   FLP and LFT's in 6-8 weeks  Duration of Discharge Encounter   Greater than 30 minutes including physician time.  Signed, Erma Heritage, PA-C 10/13/2017, 8:25 AM

## 2017-10-13 NOTE — Progress Notes (Signed)
[name] from care management called and said Mindy Gould will be 24.99 per month notified patient  Also patient does not have anyone to come to hospital to pick her up, her car is in the parking lot at the hospital.  Notified Bernerd Pho, [place].  She states ok for patient to drive herself to her home.

## 2017-10-13 NOTE — Progress Notes (Signed)
Progress Note  Patient Name: Mindy Gould Date of Encounter: 10/13/2017  Primary Cardiologist: Dr Sallyanne Kuster  Subjective   No chest pain or dyspnea  Inpatient Medications    Scheduled Meds: . active partnership for health of your heart book   Does not apply Once  . [COMPLETED] aspirin  81 mg Oral Pre-Cath  . aspirin  81 mg Oral Daily  . atorvastatin  80 mg Oral q1800  . gabapentin  100 mg Oral TID  . lisinopril  10 mg Oral Daily  . sodium chloride flush  3 mL Intravenous Q12H  . ticagrelor  90 mg Oral BID   Continuous Infusions: . sodium chloride     PRN Meds: sodium chloride, acetaminophen, ondansetron (ZOFRAN) IV, sodium chloride flush   Vital Signs    Vitals:   10/12/17 1635 10/12/17 1708 10/12/17 1940 10/13/17 0415  BP: 111/73 (!) 109/57 108/66 (!) 100/55  Pulse: 84 71 73 65  Resp: 10 20 (!) 23 17  Temp:  98.1 F (36.7 C) 98 F (36.7 C) 97.8 F (36.6 C)  TempSrc:  Oral Oral Oral  SpO2: 99% 97% 96% 96%  Weight:    109 lb 9.1 oz (49.7 kg)  Height:        Intake/Output Summary (Last 24 hours) at 10/13/2017 0728 Last data filed at 10/12/2017 2232 Gross per 24 hour  Intake 396.25 ml  Output 400 ml  Net -3.75 ml   Filed Weights   10/12/17 1246 10/13/17 0415  Weight: 104 lb (47.2 kg) 109 lb 9.1 oz (49.7 kg)    Telemetry    Sinus - Personally Reviewed   Physical Exam   GEN: No acute distress.   Neck: No JVD Cardiac: RRR, no murmurs, rubs, or gallops.  Respiratory: Clear to auscultation bilaterally. GI: Soft, nontender, non-distended  MS: No edema; radial cath site with no hematoma Neuro:  Nonfocal  Psych: Normal affect   Labs    Chemistry Recent Labs  Lab 10/12/17 1253  NA 138  K 3.5  CL 101  CO2 22  GLUCOSE 94  BUN 11  CREATININE 0.83  CALCIUM 9.4  GFRNONAA >60  GFRAA >60  ANIONGAP 15     Hematology Recent Labs  Lab 10/12/17 1253 10/13/17 0623  WBC 8.2 8.9  RBC 4.90 4.55  HGB 14.8 13.6  HCT 44.1 41.1  MCV 90.0 90.3    MCH 30.2 29.9  MCHC 33.6 33.1  RDW 12.9 12.7  PLT 234 191    Cardiac Enzymes Recent Labs  Lab 10/12/17 1253 10/12/17 1729 10/12/17 2235  TROPONINI 0.19* 0.23* 0.53*    Patient Profile     62 y.o. female admitted with non-ST elevation myocardial infarction.  LV function normal.  Status post PCI of RCA.  Assessment & Plan    1 non-ST elevation myocardial infarction-patient doing well status post PCI of RCA.  Continue aspirin, Brilinta and statin.  LV function hyperdynamic on ventriculogram.  Add metoprolol 12.5 mg twice daily.  2 hypertension-blood pressure controlled to borderline low.  Given addition of metoprolol I will discontinue lisinopril.  Follow blood pressure at home and adjust regimen as needed.  3 tobacco abuse-patient counseled on discontinuing.  Plan discharge today.  Follow-up with Dr. Sallyanne Kuster in 4-6 weeks > 30 min PA and physician time D2  For questions or updates, please contact [place] Please consult [email or web address] for contact info under Cardiology/STEMI.      Signed, Kirk Ruths, MD  10/13/2017, 7:28 AM

## 2017-10-13 NOTE — Progress Notes (Signed)
CARDIAC REHAB PHASE I    Pt ambulating on her own and with staff, sitting at bedside awaiting ride for transport home.  Phase I ambulation deferred.  Education completed including risk factor modification, low fat-low cholesterol diet, exercise, and medication compliance.  Smoking cessation counseling provided. Pt instructed to call [phone].  Pt seems contemplative to quitting. Pt has already cut back and using ecigarettes without nicotine. Pt caused about unknown effects of chemicals present in vape products.  Pt offered emotional support and reassurance. Pt oriented to outpatient cardiac rehab.  At pt request, referral will be sent to [facility].  Understanding verbalized   [place]  10/13/17  10:11 AM

## 2017-10-14 ENCOUNTER — Encounter (HOSPITAL_COMMUNITY): Payer: Self-pay | Admitting: Cardiovascular Disease

## 2017-10-15 MED FILL — Lidocaine HCl Local Inj 1%: INTRAMUSCULAR | Qty: 20 | Status: AC

## 2017-10-15 MED FILL — Nitroglycerin IV Soln 200 MCG/ML in D5W: INTRAVENOUS | Qty: 250 | Status: AC

## 2017-10-29 ENCOUNTER — Other Ambulatory Visit: Payer: Self-pay | Admitting: Physician Assistant

## 2017-10-29 ENCOUNTER — Other Ambulatory Visit: Payer: Self-pay

## 2017-10-29 ENCOUNTER — Encounter: Payer: Self-pay | Admitting: Physician Assistant

## 2017-10-29 ENCOUNTER — Ambulatory Visit: Payer: 59 | Admitting: Physician Assistant

## 2017-10-29 VITALS — BP 126/74 | HR 69 | Ht 64.0 in | Wt 103.0 lb

## 2017-10-29 DIAGNOSIS — I214 Non-ST elevation (NSTEMI) myocardial infarction: Secondary | ICD-10-CM | POA: Diagnosis not present

## 2017-10-29 DIAGNOSIS — I10 Essential (primary) hypertension: Secondary | ICD-10-CM | POA: Diagnosis not present

## 2017-10-29 DIAGNOSIS — R0602 Shortness of breath: Secondary | ICD-10-CM

## 2017-10-29 DIAGNOSIS — E785 Hyperlipidemia, unspecified: Secondary | ICD-10-CM

## 2017-10-29 MED ORDER — NITROGLYCERIN 0.4 MG SL SUBL: 0.4000 mg | SUBLINGUAL_TABLET | SUBLINGUAL | 3 refills | Status: DC | PRN

## 2017-10-29 NOTE — Patient Instructions (Signed)
Medication Instructions:   NO CHANGE  Testing/Procedures:  A chest x-ray takes a picture of the organs and structures inside the chest, including the heart, lungs, and blood vessels. This test can show several things, including, whether the heart is enlarges; whether fluid is building up in the lungs; and whether pacemaker / defibrillator leads are still in place. AT [facility]  Your physician has requested that you have an echocardiogram. Echocardiography is a painless test that uses sound waves to create images of your heart. It provides your doctor with information about the size and shape of your heart and how well your heart's chambers and valves are working. This procedure takes approximately one hour. There are no restrictions for this procedure.SCHEDULE AT [place]:  Your physician recommends that you schedule a follow-up appointment in: [place]   If you need a refill on your cardiac medications before your next appointment, please call your pharmacy.
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
## 2017-10-30 NOTE — Progress Notes (Signed)
Thank you, I'll keep my eyes open for echo and xray. MCr

## 2017-10-31 ENCOUNTER — Telehealth: Payer: Self-pay | Admitting: Physician Assistant

## 2017-10-31 NOTE — Telephone Encounter (Signed)
New message      [facility] calling for order for [facility]. Please fax [phone], ATTN : Jeani Hawking

## 2017-10-31 NOTE — Telephone Encounter (Signed)
Printed and manually faxed

## 2017-11-02 ENCOUNTER — Other Ambulatory Visit (HOSPITAL_COMMUNITY): Payer: Self-pay | Admitting: Family Medicine

## 2017-11-02 DIAGNOSIS — I6523 Occlusion and stenosis of bilateral carotid arteries: Secondary | ICD-10-CM

## 2017-11-02 DIAGNOSIS — I739 Peripheral vascular disease, unspecified: Secondary | ICD-10-CM

## 2017-11-05 ENCOUNTER — Telehealth: Payer: Self-pay | Admitting: Cardiovascular Disease

## 2017-11-05 NOTE — Telephone Encounter (Signed)
New message  Patient calling with concerns of shortness of breath.   Pt c/o Shortness Of Breath: STAT if SOB developed within the last 24 hours or pt is noticeably SOB on the phone  1. Are you currently SOB (can you hear that pt is SOB on the phone)? NO  2. How long have you been experiencing SOB? 2 weeks  3. Are you SOB when sitting or when up moving around?moving around  4. Are you currently experiencing any other symptoms? NO

## 2017-11-05 NOTE — Telephone Encounter (Signed)
Lm to call back ./cy 

## 2017-11-05 NOTE — Telephone Encounter (Signed)
Spoke with pt and is calling with complaints of sob with very little activity. Per pt no weight gain noted and diet consists of a lot of fruits and vegetables feels like Briilinta may be the culprit has tried taking med with caffeine with no difference.Discussed with Dr Sallyanne Kuster pt has seen  [name] Pa post stent and ordered echo and cxr these have not been done as of yet per pt due to cost they are scheduled in a week or two.Dr Sallyanne Kuster would like to make changes after test are done or if pt can take Briilinta  until end of April would be able to stop med at that time. Pt aware will await echo and cxr results to make decision on possible med change .Will send to Dr Sallyanne Kuster .Adonis Housekeeper

## 2017-11-14 ENCOUNTER — Ambulatory Visit (HOSPITAL_COMMUNITY)
Admission: RE | Admit: 2017-11-14 | Discharge: 2017-11-14 | Disposition: A | Discharge status: Home / Self Care | Payer: 59 | Source: Ambulatory Visit | Attending: Cardiology | Admitting: Cardiology

## 2017-11-14 ENCOUNTER — Other Ambulatory Visit (HOSPITAL_COMMUNITY): Payer: Self-pay | Admitting: Family Medicine

## 2017-11-14 DIAGNOSIS — Z87891 Personal history of nicotine dependence: Secondary | ICD-10-CM | POA: Insufficient documentation

## 2017-11-14 DIAGNOSIS — R42 Dizziness and giddiness: Secondary | ICD-10-CM

## 2017-11-14 DIAGNOSIS — I10 Essential (primary) hypertension: Secondary | ICD-10-CM | POA: Insufficient documentation

## 2017-11-14 DIAGNOSIS — E785 Hyperlipidemia, unspecified: Secondary | ICD-10-CM | POA: Insufficient documentation

## 2017-11-14 DIAGNOSIS — I2510 Atherosclerotic heart disease of native coronary artery without angina pectoris: Secondary | ICD-10-CM | POA: Insufficient documentation

## 2017-11-14 DIAGNOSIS — Z955 Presence of coronary angioplasty implant and graft: Secondary | ICD-10-CM | POA: Diagnosis not present

## 2017-11-14 DIAGNOSIS — M79606 Pain in leg, unspecified: Secondary | ICD-10-CM | POA: Insufficient documentation

## 2017-11-14 DIAGNOSIS — I252 Old myocardial infarction: Secondary | ICD-10-CM | POA: Diagnosis not present

## 2017-11-15 DIAGNOSIS — R0602 Shortness of breath: Secondary | ICD-10-CM | POA: Diagnosis not present

## 2017-11-20 ENCOUNTER — Telehealth: Payer: Self-pay

## 2017-11-20 NOTE — Telephone Encounter (Signed)
Mindy Gould PH# [phone]

## 2017-11-23 ENCOUNTER — Telehealth: Payer: Self-pay | Admitting: Cardiovascular Disease

## 2017-11-23 MED ORDER — CLOPIDOGREL BISULFATE 75 MG PO TABS: ORAL_TABLET | ORAL | 3 refills | Status: DC

## 2017-11-23 NOTE — Telephone Encounter (Signed)
Patient has been called and instructed to take 300 mg of Plavix today (4 tablets) and then take 75 mg daily after that. She will start taking an 81 mg Aspirin. She has been instructed to call back with any concerns and to not stop taking the medication. She verbalized her understanding.

## 2017-11-23 NOTE — Telephone Encounter (Signed)
New message ° ° ° °Patient calling for echo results °

## 2017-11-23 NOTE — Telephone Encounter (Signed)
Returned the call to the patient. She stated that she was calling for her echo results. Her echo was completed at [facility]. She will call them and have them fax the results to the office.  While on the phone the patient stated that she has stopped taking Brilinta, Atorvastatin and Metoprolol. She had complaints of shortness of breath and therefore decided that it was one of the medications and discontinued them. She is currently taking Asprin 325 mg. She recently had a cardiac cath with stents on 1/25 and had been educated on the importance of taking this medication. She has refused to take the Brilinta due to the side effects. She again was educated that it was extremely important to take the medication after she had the cath and stent placed but she has refused. Message routed to the provider for his knowledge.

## 2017-11-23 NOTE — Telephone Encounter (Signed)
Have her take clopidogrel instead. Load 300 mg (4x 75 mg today ASAP) and then 75 mg daily. Only take ASA 81 mg daily. MCr

## 2018-01-29 ENCOUNTER — Ambulatory Visit: Payer: 59 | Admitting: Cardiovascular Disease

## 2018-02-01 NOTE — Telephone Encounter (Signed)
For [facility] to send our office a copy of ECHO.

## 2018-02-21 ENCOUNTER — Other Ambulatory Visit: Payer: Self-pay | Admitting: Family Medicine

## 2018-02-21 DIAGNOSIS — Z1231 Encounter for screening mammogram for malignant neoplasm of breast: Secondary | ICD-10-CM

## 2018-02-21 DIAGNOSIS — R5381 Other malaise: Secondary | ICD-10-CM

## 2018-03-07 ENCOUNTER — Other Ambulatory Visit: Payer: Self-pay | Admitting: Family Medicine

## 2018-03-07 DIAGNOSIS — E2839 Other primary ovarian failure: Secondary | ICD-10-CM

## 2018-04-04 ENCOUNTER — Ambulatory Visit: Payer: 59 | Admitting: Cardiovascular Disease

## 2018-04-04 ENCOUNTER — Encounter: Payer: Self-pay | Admitting: Cardiovascular Disease

## 2018-04-04 VITALS — BP 102/60 | HR 94 | Ht 64.5 in | Wt 102.2 lb

## 2018-04-04 DIAGNOSIS — I10 Essential (primary) hypertension: Secondary | ICD-10-CM | POA: Diagnosis not present

## 2018-04-04 DIAGNOSIS — I25118 Atherosclerotic heart disease of native coronary artery with other forms of angina pectoris: Secondary | ICD-10-CM

## 2018-04-04 DIAGNOSIS — Z79899 Other long term (current) drug therapy: Secondary | ICD-10-CM

## 2018-04-04 DIAGNOSIS — E785 Hyperlipidemia, unspecified: Secondary | ICD-10-CM | POA: Diagnosis not present
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
## 2018-04-04 NOTE — Patient Instructions (Signed)
Dr Sallyanne Kuster recommends that you schedule a follow-up appointment in 6 months. You will receive a reminder letter in the mail two months in advance. If you don't receive a letter, please call our office to schedule the follow-up appointment.  If you need a refill on your cardiac medications before your next appointment, please call your pharmacy.   Your physician recommends that you return for lab work at your convenience - FASTING.

## 2018-04-06 ENCOUNTER — Other Ambulatory Visit: Payer: Self-pay | Admitting: Cardiovascular Disease

## 2018-05-10 ENCOUNTER — Encounter: Payer: Self-pay | Admitting: Gastroenterology

## 2018-06-06 ENCOUNTER — Ambulatory Visit: Payer: 59 | Admitting: Gastroenterology

## 2018-10-03 ENCOUNTER — Ambulatory Visit (HOSPITAL_COMMUNITY): Payer: 59

## 2018-10-09 ENCOUNTER — Other Ambulatory Visit (HOSPITAL_COMMUNITY): Payer: Self-pay | Admitting: Family Medicine

## 2018-10-09 DIAGNOSIS — I739 Peripheral vascular disease, unspecified: Secondary | ICD-10-CM

## 2018-10-11 ENCOUNTER — Ambulatory Visit (HOSPITAL_COMMUNITY)
Admission: RE | Admit: 2018-10-11 | Discharge: 2018-10-11 | Disposition: A | Discharge status: Home / Self Care | Payer: 59 | Source: Ambulatory Visit | Attending: Cardiovascular Disease | Admitting: Cardiovascular Disease

## 2018-10-11 DIAGNOSIS — I739 Peripheral vascular disease, unspecified: Secondary | ICD-10-CM | POA: Diagnosis present

## 2018-10-22 ENCOUNTER — Encounter: Payer: Self-pay | Admitting: Cardiovascular Disease

## 2018-10-22 ENCOUNTER — Ambulatory Visit (INDEPENDENT_AMBULATORY_CARE_PROVIDER_SITE_OTHER): Payer: 59 | Admitting: Cardiovascular Disease

## 2018-10-22 VITALS — BP 98/66 | HR 78 | Ht 64.0 in | Wt 101.6 lb

## 2018-10-22 DIAGNOSIS — I739 Peripheral vascular disease, unspecified: Secondary | ICD-10-CM | POA: Insufficient documentation

## 2018-10-22 DIAGNOSIS — I10 Essential (primary) hypertension: Secondary | ICD-10-CM

## 2018-10-22 NOTE — Progress Notes (Signed)
Thanks for keeping me posted, Mindy Gould

## 2018-10-22 NOTE — Assessment & Plan Note (Signed)
Mindy Gould was referred to me by Dr. Darron Doom, her PCP, for evaluation of PAD.  She does have a history of ischemic heart disease status post coronary intervention by Dr. Angelena Form 10/12/2017.  Her other risk factors include a long history of tobacco abuse recently discontinued as well as hypertension hyperlipidemia.  She noticed fairly sudden onset of left foot and calf discomfort at the end of November last year.  Her foot would turn red and would be cold and numb.  She had Dopplers performed in our office 10/11/2018 that revealed an occluded left popliteal artery.  Over the last couple weeks she is noticed that her symptoms have markedly improved spontaneously.  I suspect she is developed significant collaterals.  This point, she denies claudication.  She has slight numbness to the tips of her toes but she does not feel these warrant intervention at this time.

## 2018-10-22 NOTE — Progress Notes (Signed)
10/22/2018 [name]   1956/09/09  [id number]  Primary Physician Hayden Rasmussen, MD Primary Cardiologist: Lorretta Harp MD Garret Reddish, [place], [place]  HPI:  Mindy Gould is a 63 y.o. appearing married Caucasian female with no children referred by Dr. Darron Doom, her PCP, for peripheral vascular valuation because of an abnormal recent Doppler study and pain in her left foot and leg previously.  She works as a Secretary/administrator in an Information systems manager.  Risk factors include 50-pack-year tobacco abuse having recently quit 2 weeks ago as well as treated hypertension hyperlipidemia.  She is had a heart attack back 10/12/2017 and stenting of a PLA branch by Dr. Angelena Form.  She currently denies chest pain or shortness of breath.  She developed left leg pain back in late November of last year which she attributes to accident with her horse.  Her left foot was numb and she had discomfort when she walked.  The symptoms have gradually improved.  Recent Dopplers performed 10/11/2018 revealed an occluded left popliteal artery.  She does have dependent rubor and elevation pallor of her left foot.   Current Meds  Medication Sig  . aspirin EC 81 MG tablet Take 81 mg by mouth daily.  Marland Kitchen atorvastatin (LIPITOR) 80 MG tablet Take 1 tablet (80 mg total) by mouth daily at 6 PM.  . gabapentin (NEURONTIN) 300 MG capsule Take 300 mg by mouth 3 (three) times daily.  Marland Kitchen lisinopril-hydrochlorothiazide (PRINZIDE,ZESTORETIC) 10-12.5 MG tablet See admin instructions.  . nitroGLYCERIN (NITROSTAT) 0.4 MG SL tablet Place 1 tablet (0.4 mg total) under the tongue every 5 (five) minutes as needed for chest pain.  . traMADol (ULTRAM) 50 MG tablet Take by mouth every 6 (six) hours as needed.     Allergies  Allergen Reactions  . Benadryl [Diphenhydramine] Nausea Only and Swelling    Social History   Socioeconomic History  . Marital status: Widowed    Spouse name: Not on file  .  Number of children: Not on file  . Years of education: Not on file  . Highest education level: Not on file  Occupational History  . Occupation: Pension scheme manager: [employer]   Social Needs  . Financial resource strain: Not on file  . Food insecurity:    Worry: Not on file    Inability: Not on file  . Transportation needs:    Medical: Not on file    Non-medical: Not on file  Tobacco Use  . Smoking status: Current Some Day Smoker    Packs/day: 0.12    Years: 50.00    Pack years: 6.00    Types: Cigarettes  . Smokeless tobacco: Never Used  Substance and Sexual Activity  . Alcohol use: No    Frequency: Never  . Drug use: No  . Sexual activity: Not Currently  Lifestyle  . Physical activity:    Days per week: Not on file    Minutes per session: Not on file  . Stress: Not on file  Relationships  . Social connections:    Talks on phone: Not on file    Gets together: Not on file    Attends religious service: Not on file    Active member of club or organization: Not on file    Attends meetings of clubs or organizations: Not on file    Relationship status: Not on file  . Intimate partner violence:    Fear of  current or ex partner: Not on file    Emotionally abused: Not on file    Physically abused: Not on file    Forced sexual activity: Not on file  Other Topics Concern  . Not on file  Social History Narrative  . Not on file     Review of Systems: General: negative for chills, fever, night sweats or weight changes.  Cardiovascular: negative for chest pain, dyspnea on exertion, edema, orthopnea, palpitations, paroxysmal nocturnal dyspnea or shortness of breath Dermatological: negative for rash Respiratory: negative for cough or wheezing Urologic: negative for hematuria Abdominal: negative for nausea, vomiting, diarrhea, bright red blood per rectum, melena, or hematemesis Neurologic: negative for visual changes, syncope, or dizziness All other  systems reviewed and are otherwise negative except as noted above.    Blood pressure 98/66, pulse 78, height 5\' 4"  (1.626 m), weight 101 lb 9.6 oz (46.1 kg).  General appearance: alert and no distress Neck: no adenopathy, no carotid bruit, no JVD, supple, symmetrical, trachea midline and thyroid not enlarged, symmetric, no tenderness/mass/nodules Lungs: clear to auscultation bilaterally Heart: regular rate and rhythm, S1, S2 normal, no murmur, click, rub or gallop Extremities: extremities normal, atraumatic, no cyanosis or edema Pulses: 2+ and symmetric Skin: Dependent rubor left foot Neurologic: Alert and oriented X 3, normal strength and tone. Normal symmetric reflexes. Normal coordination and gait  EKG not performed today  ASSESSMENT AND PLAN:   Peripheral arterial disease ([place]) Ms. Friel was referred to me by Dr. Darron Doom, her PCP, for evaluation of PAD.  She does have a history of ischemic heart disease status post coronary intervention by Dr. Angelena Form 10/12/2017.  Her other risk factors include a long history of tobacco abuse recently discontinued as well as hypertension hyperlipidemia.  She noticed fairly sudden onset of left foot and calf discomfort at the end of November last year.  Her foot would turn red and would be cold and numb.  She had Dopplers performed in our office 10/11/2018 that revealed an occluded left popliteal artery.  Over the last couple weeks she is noticed that her symptoms have markedly improved spontaneously.  I suspect she is developed significant collaterals.  This point, she denies claudication.  She has slight numbness to the tips of her toes but she does not feel these warrant intervention at this time.      Lorretta Harp MD FACP,FACC,FAHA, [facility] 10/22/2018 3:38 PM

## 2018-10-22 NOTE — Patient Instructions (Signed)
Medication Instructions:  NONE If you need a refill on your cardiac medications before your next appointment, please call your pharmacy.   Lab work: NONE If you have labs (blood work) drawn today and your tests are completely normal, you will receive your results only by: Marland Kitchen MyChart Message (if you have MyChart) OR . A paper copy in the mail If you have any lab test that is abnormal or we need to change your treatment, we will call you to review the results.  Testing/Procedures: NONE  Follow-Up: At [facility], you and your health needs are our priority.  As part of our continuing mission to provide you with exceptional heart care, we have created designated Provider Care Teams.  These Care Teams include your primary Cardiologist (physician) and Advanced Practice Providers (APPs -  Physician Assistants and Nurse Practitioners) who all work together to provide you with the care you need, when you need it. . You will need a follow up appointment in 6 months.  Please call our office 2 months in advance to schedule this appointment.  You may see Dr. Gwenlyn Found or one of the following Advanced Practice Providers on your designated Care Team:   . Kerin Ransom, [place] . Almyra Deforest, PA-C . Fabian Sharp, PA-C . Jory Sims, DNP . [name], PA-C . Roby Lofts, PA-C . Sande Rives, PA-C

## 2019-01-07 ENCOUNTER — Telehealth: Payer: Self-pay | Admitting: *Deleted

## 2019-01-07 NOTE — Telephone Encounter (Signed)
01/07/19 LMOM @ 10:35 am, RE: follow up appointment.

## 2019-02-12 ENCOUNTER — Telehealth: Payer: Self-pay | Admitting: *Deleted

## 2019-02-12 NOTE — Telephone Encounter (Signed)
A message was left, re: follow up visit. 

## 2019-07-10 ENCOUNTER — Other Ambulatory Visit: Payer: Self-pay

## 2019-07-10 ENCOUNTER — Ambulatory Visit: Payer: 59 | Admitting: Cardiovascular Disease

## 2019-07-10 ENCOUNTER — Encounter: Payer: Self-pay | Admitting: Cardiovascular Disease

## 2019-07-10 VITALS — BP 116/75 | HR 79 | Ht 64.5 in | Wt 101.0 lb

## 2019-07-10 DIAGNOSIS — E785 Hyperlipidemia, unspecified: Secondary | ICD-10-CM | POA: Diagnosis not present

## 2019-07-10 DIAGNOSIS — I739 Peripheral vascular disease, unspecified: Secondary | ICD-10-CM | POA: Diagnosis not present

## 2019-07-10 DIAGNOSIS — I2510 Atherosclerotic heart disease of native coronary artery without angina pectoris: Secondary | ICD-10-CM | POA: Diagnosis not present

## 2019-07-10 DIAGNOSIS — I10 Essential (primary) hypertension: Secondary | ICD-10-CM | POA: Diagnosis not present

## 2019-07-10 DIAGNOSIS — J449 Chronic obstructive pulmonary disease, unspecified: Secondary | ICD-10-CM

## 2019-07-10 LAB — COMPREHENSIVE METABOLIC PANEL
ALT: 10 IU/L (ref 0–32)
AST: 23 IU/L (ref 0–40)
Albumin/Globulin Ratio: 1.1 — ABNORMAL LOW (ref 1.2–2.2)
Albumin: 4.5 g/dL (ref 3.8–4.8)
Alkaline Phosphatase: 87 IU/L (ref 39–117)
BUN/Creatinine Ratio: 18 (ref 12–28)
BUN: 14 mg/dL (ref 8–27)
Bilirubin Total: 0.3 mg/dL (ref 0.0–1.2)
CO2: 23 mmol/L (ref 20–29)
Calcium: 9.6 mg/dL (ref 8.7–10.3)
Chloride: 97 mmol/L (ref 96–106)
Creatinine, Ser: 0.78 mg/dL (ref 0.57–1.00)
GFR calc Af Amer: 93 mL/min/{1.73_m2} (ref >59)
GFR calc non Af Amer: 81 mL/min/{1.73_m2} (ref >59)
Globulin, Total: 4 g/dL (ref 1.5–4.5)
Glucose: 95 mg/dL (ref 65–99)
Potassium: 5 mmol/L (ref 3.5–5.2)
Sodium: 135 mmol/L (ref 134–144)
Total Protein: 8.5 g/dL (ref 6.0–8.5)

## 2019-07-10 LAB — LIPID PANEL
Chol/HDL Ratio: 3 ratio (ref 0.0–4.4)
Cholesterol, Total: 199 mg/dL (ref 100–199)
HDL: 66 mg/dL (ref >39)
LDL Chol Calc (NIH): 121 mg/dL — ABNORMAL HIGH (ref 0–99)
Triglycerides: 65 mg/dL (ref 0–149)
VLDL Cholesterol Cal: 12 mg/dL (ref 5–40)
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
## 2019-07-10 NOTE — Patient Instructions (Signed)
Medication Instructions:  No changes *If you need a refill on your cardiac medications before your next appointment, please call your pharmacy*  Lab Work: Your provider would like for you to have the following labs today: Lipid and Cmet  If you have labs (blood work) drawn today and your tests are completely normal, you will receive your results only by: Marland Kitchen MyChart Message (if you have MyChart) OR . A paper copy in the mail If you have any lab test that is abnormal or we need to change your treatment, we will call you to review the results.  Testing/Procedures: None ordered  Follow-Up: At [facility], you and your health needs are our priority.  As part of our continuing mission to provide you with exceptional heart care, we have created designated Provider Care Teams.  These Care Teams include your primary Cardiologist (physician) and Advanced Practice Providers (APPs -  Physician Assistants and Nurse Practitioners) who all work together to provide you with the care you need, when you need it.  Your next appointment:   12 months  The format for your next appointment:   In Person  Provider:   Sanda Klein, MD

## 2019-07-11 ENCOUNTER — Telehealth: Payer: Self-pay | Admitting: *Deleted

## 2019-07-11 NOTE — Telephone Encounter (Signed)
-----   Message from Sanda Klein, MD sent at 07/10/2019  6:07 PM EDT ----- Unfortunately, the LDL cholesterol is way too high in order to prevent future blockages. Please confirm that she is taking the atorvastatin daily. If so, please add ezetimibe 10 mg daily and recheck lipids in 3 months.

## 2019-07-11 NOTE — Telephone Encounter (Signed)
Left a message for the patient to call back.  

## 2019-07-16 ENCOUNTER — Encounter: Payer: Self-pay | Admitting: *Deleted

## 2019-07-16 NOTE — Telephone Encounter (Signed)
Patient contacted through [facility]. Please see for more detail.

## 2019-07-16 NOTE — Telephone Encounter (Signed)
Left a message for the patient to call back.  

## 2019-07-18 ENCOUNTER — Other Ambulatory Visit: Payer: Self-pay | Admitting: *Deleted

## 2019-07-18 DIAGNOSIS — I10 Essential (primary) hypertension: Secondary | ICD-10-CM

## 2019-07-18 DIAGNOSIS — E785 Hyperlipidemia, unspecified: Secondary | ICD-10-CM

## 2019-07-18 MED ORDER — EZETIMIBE 10 MG PO TABS: 10.0000 mg | ORAL_TABLET | Freq: Every day | ORAL | 3 refills | Status: DC

## 2019-07-18 MED ORDER — ATORVASTATIN CALCIUM 80 MG PO TABS: 80.0000 mg | ORAL_TABLET | Freq: Every day | ORAL | 3 refills | Status: DC

## 2021-12-01 ENCOUNTER — Encounter: Payer: Self-pay | Admitting: Cardiology

## 2021-12-01 DIAGNOSIS — J189 Pneumonia, unspecified organism: Secondary | ICD-10-CM

## 2022-05-17 DIAGNOSIS — C189 Malignant neoplasm of colon, unspecified: Secondary | ICD-10-CM | POA: Insufficient documentation

## 2022-05-29 ENCOUNTER — Encounter: Payer: Self-pay | Admitting: Oncology

## 2022-05-29 ENCOUNTER — Inpatient Hospital Stay: Payer: Medicare HMO

## 2022-05-29 ENCOUNTER — Inpatient Hospital Stay: Payer: Medicare HMO | Attending: Oncology | Admitting: Oncology

## 2022-05-29 VITALS — BP 110/61 | HR 93 | Temp 98.2°F | Resp 18 | Ht 63.6 in | Wt 82.6 lb

## 2022-05-29 DIAGNOSIS — I10 Essential (primary) hypertension: Secondary | ICD-10-CM

## 2022-05-29 DIAGNOSIS — Z720 Tobacco use: Secondary | ICD-10-CM

## 2022-05-29 DIAGNOSIS — D649 Anemia, unspecified: Secondary | ICD-10-CM | POA: Diagnosis not present

## 2022-05-29 DIAGNOSIS — C187 Malignant neoplasm of sigmoid colon: Secondary | ICD-10-CM

## 2022-05-29 DIAGNOSIS — C189 Malignant neoplasm of colon, unspecified: Secondary | ICD-10-CM | POA: Insufficient documentation

## 2022-05-29 DIAGNOSIS — Z90710 Acquired absence of both cervix and uterus: Secondary | ICD-10-CM

## 2022-05-29 DIAGNOSIS — F17290 Nicotine dependence, other tobacco product, uncomplicated: Secondary | ICD-10-CM | POA: Diagnosis not present

## 2022-05-29 DIAGNOSIS — R636 Underweight: Secondary | ICD-10-CM

## 2022-05-29 DIAGNOSIS — C20 Malignant neoplasm of rectum: Secondary | ICD-10-CM | POA: Insufficient documentation

## 2022-05-29 LAB — CMP (CANCER CENTER ONLY)
ALT: 8 U/L (ref 0–44)
AST: 17 U/L (ref 15–41)
Albumin: 4.6 g/dL (ref 3.5–5.0)
Alkaline Phosphatase: 75 U/L (ref 38–126)
Anion gap: 10 (ref 5–15)
BUN: 14 mg/dL (ref 8–23)
CO2: 25 mmol/L (ref 22–32)
Calcium: 9.8 mg/dL (ref 8.9–10.3)
Chloride: 103 mmol/L (ref 98–111)
Creatinine: 0.53 mg/dL (ref 0.44–1.00)
GFR, Estimated: >60 mL/min (ref >60)
Glucose, Bld: 106 mg/dL — ABNORMAL HIGH (ref 70–99)
Potassium: 4 mmol/L (ref 3.5–5.1)
Sodium: 138 mmol/L (ref 135–145)
Total Bilirubin: 0.5 mg/dL (ref 0.3–1.2)
Total Protein: 9.1 g/dL — ABNORMAL HIGH (ref 6.5–8.1)

## 2022-05-29 LAB — CBC WITH DIFFERENTIAL (CANCER CENTER ONLY)
Abs Immature Granulocytes: 0.06 10*3/uL (ref 0.00–0.07)
Basophils Absolute: 0 10*3/uL (ref 0.0–0.1)
Basophils Relative: 0 %
Eosinophils Absolute: 0.1 10*3/uL (ref 0.0–0.5)
Eosinophils Relative: 1 %
HCT: 43.2 % (ref 36.0–46.0)
Hemoglobin: 13.5 g/dL (ref 12.0–15.0)
Immature Granulocytes: 1 %
Lymphocytes Relative: 9 %
Lymphs Abs: 1.2 10*3/uL (ref 0.7–4.0)
MCH: 30 pg (ref 26.0–34.0)
MCHC: 31.3 g/dL (ref 30.0–36.0)
MCV: 96 fL (ref 80.0–100.0)
Monocytes Absolute: 0.6 10*3/uL (ref 0.1–1.0)
Monocytes Relative: 5 %
Neutro Abs: 11.3 10*3/uL — ABNORMAL HIGH (ref 1.7–7.7)
Neutrophils Relative %: 84 %
Platelet Count: 284 10*3/uL (ref 150–400)
RBC: 4.5 MIL/uL (ref 3.87–5.11)
RDW: 12.9 % (ref 11.5–15.5)
WBC Count: 13.2 10*3/uL — ABNORMAL HIGH (ref 4.0–10.5)
nRBC: 0 % (ref 0.0–0.2)

## 2022-05-29 LAB — FERRITIN: Ferritin: 11 ng/mL (ref 11–307)
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
## 2022-05-31 LAB — CEA: CEA: 4.6 ng/mL (ref 0.0–4.7)

## 2022-06-01 ENCOUNTER — Telehealth: Payer: Self-pay

## 2022-06-01 NOTE — Telephone Encounter (Signed)
Contacted patient and notified her that elevated WBC is due to her cancer

## 2022-06-01 NOTE — Telephone Encounter (Signed)
Patient called this morning after viewing her lab results on MyChart. She has questions about her WBC being elevated and if she is currently fighting off an infection?

## 2022-06-05 ENCOUNTER — Encounter: Payer: Self-pay | Admitting: Oncology

## 2022-06-05 NOTE — Progress Notes (Signed)
START ON PATHWAY REGIMEN - Colorectal     A cycle is every 21 days:     Capecitabine   **Always confirm dose/schedule in your pharmacy ordering system**  Patient Characteristics: Postoperative without Neoadjuvant Therapy, M0 (Pathologic Staging), Colon, Stage III, Low Risk (pT1-3, pN1) Tumor Location: Colon Therapeutic Status: Postoperative without Neoadjuvant Therapy, M0 (Pathologic Staging) AJCC M Category: cM0 AJCC T Category: pT3 AJCC N Category: pN1a AJCC 8 Stage Grouping: IIIB Intent of Therapy: Curative Intent, Discussed with Patient

## 2022-06-05 NOTE — Progress Notes (Signed)
DISCONTINUE ON PATHWAY REGIMEN - Colorectal     A cycle is every 21 days:     Capecitabine   **Always confirm dose/schedule in your pharmacy ordering system**  REASON: Other Reason PRIOR TREATMENT: [id number]: Capecitabine 1,000 mg/m2 BID D1-14 q21 Days x 8 Cycles TREATMENT RESPONSE: Unable to Evaluate  START ON PATHWAY REGIMEN - Colorectal     A cycle is every 21 days:     Capecitabine   **Always confirm dose/schedule in your pharmacy ordering system**  Patient Characteristics: Postoperative without Neoadjuvant Therapy, M0 (Pathologic Staging), Colon, Stage III, Low Risk (pT1-3, pN1) Tumor Location: Colon Therapeutic Status: Postoperative without Neoadjuvant Therapy, M0 (Pathologic Staging) AJCC M Category: cM0 AJCC T Category: pT3 AJCC N Category: pN1a AJCC 8 Stage Grouping: IIIB Intent of Therapy: Curative Intent, Discussed with Patient

## 2022-06-06 ENCOUNTER — Other Ambulatory Visit: Payer: Self-pay

## 2022-06-12 ENCOUNTER — Telehealth: Payer: Self-pay | Admitting: Dietician

## 2022-06-12 NOTE — Telephone Encounter (Signed)
Contacted pt to schedule a nutrition appt. Unable to reach via phone, voicemail was left.

## 2022-06-15 ENCOUNTER — Other Ambulatory Visit: Payer: Self-pay
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
## 2022-06-19 ENCOUNTER — Ambulatory Visit: Payer: Medicare HMO | Admitting: Oncology

## 2022-06-20 ENCOUNTER — Other Ambulatory Visit: Payer: Self-pay

## 2022-06-23 ENCOUNTER — Other Ambulatory Visit: Payer: Self-pay | Admitting: Oncology

## 2022-06-23 DIAGNOSIS — C187 Malignant neoplasm of sigmoid colon: Secondary | ICD-10-CM

## 2022-06-27 ENCOUNTER — Ambulatory Visit: Payer: Medicare HMO | Admitting: Dietician
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
## 2022-06-28 ENCOUNTER — Encounter: Payer: Self-pay | Admitting: Oncology

## 2022-06-28 ENCOUNTER — Inpatient Hospital Stay: Payer: Medicare HMO | Attending: Oncology | Admitting: Oncology

## 2022-06-28 ENCOUNTER — Telehealth: Payer: Self-pay | Admitting: Pharmacy Technician

## 2022-06-28 ENCOUNTER — Other Ambulatory Visit (HOSPITAL_COMMUNITY): Payer: Self-pay

## 2022-06-28 ENCOUNTER — Telehealth: Payer: Self-pay

## 2022-06-28 VITALS — BP 83/51 | HR 82 | Temp 98.1°F | Resp 16 | Ht 63.6 in | Wt 91.3 lb

## 2022-06-28 DIAGNOSIS — R636 Underweight: Secondary | ICD-10-CM

## 2022-06-28 DIAGNOSIS — G6289 Other specified polyneuropathies: Secondary | ICD-10-CM

## 2022-06-28 DIAGNOSIS — C187 Malignant neoplasm of sigmoid colon: Secondary | ICD-10-CM | POA: Diagnosis not present

## 2022-06-28 DIAGNOSIS — Z720 Tobacco use: Secondary | ICD-10-CM

## 2022-06-28 DIAGNOSIS — G629 Polyneuropathy, unspecified: Secondary | ICD-10-CM | POA: Insufficient documentation

## 2022-06-28 MED ORDER — CAPECITABINE 500 MG PO TABS
ORAL_TABLET | ORAL | 3 refills | Status: DC
  Filled 2022-06-28: qty 70, fill #0
  Filled 2022-07-04: qty 70, 21d supply, fill #0
  Filled 2022-07-18: qty 70, 21d supply, fill #1
  Filled 2022-08-09: qty 70, 21d supply, fill #2
  Filled 2022-08-31: qty 70, 21d supply, fill #3

## 2022-06-28 MED ORDER — CAPECITABINE 150 MG PO TABS
1000.0000 mg/m2 | ORAL_TABLET | Freq: Two times a day (BID) | ORAL | 3 refills | Status: DC
  Filled 2022-06-28: qty 252, 14d supply, fill #0

## 2022-06-28 MED ORDER — PROCHLORPERAZINE MALEATE 10 MG PO TABS
10.0000 mg | ORAL_TABLET | Freq: Four times a day (QID) | ORAL | 1 refills | Status: DC | PRN
  Filled 2022-06-28 – 2022-07-04 (×2): qty 30, 8d supply, fill #0

## 2022-06-28 MED ORDER — ONDANSETRON HCL 8 MG PO TABS
8.0000 mg | ORAL_TABLET | Freq: Three times a day (TID) | ORAL | 1 refills | Status: DC | PRN
  Filled 2022-06-28 – 2022-07-04 (×2): qty 30, 10d supply, fill #0
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
## 2022-06-28 NOTE — Telephone Encounter (Signed)
Received New start notification for  Capecitabine . Will update as we work through the benefits process.   PA is required.  Submitted a Prior Authorization request to [facility] for   Capecitabine   via CoverMyMeds. Will update once we receive a response.   Key: [id number] - PA Case ID: [id number]

## 2022-06-28 NOTE — Telephone Encounter (Addendum)
Oral Oncology Pharmacist Encounter  Received new prescription for capecitabine (Xeloda) for the treatment of stage 3 colorectal cancer as monotherapy due to neuropathy (not a candidate for CAPOX), planned duration until disease progression or unacceptable toxicity or for 6 months of therapy.  Labs from 05/29/22 assessed, no interventions needed. Prescription dose and frequency assessed. Discussed with MD about switching from '150mg'$  tabs to '500mg'$  tabs for adherence and ease of administration. MD is agreeable with switching to 2 tabs ('1000mg'$ ) in AM and 3 tabs ('1500mg'$ ) in PM.   Current medication list in Epic reviewed, DDIs with Xeloda identified: - inhalers: patient is currently using but no action is needed.  Evaluated chart and no patient barriers to medication adherence noted.   Patient agreement for treatment documented in MD note on 06/28/22.  Prescription has been e-scribed to the [facility] for benefits analysis and approval.  Oral Oncology Clinic will continue to follow for insurance authorization, copayment issues, initial counseling and start date.  Drema Halon, PharmD Hematology/Oncology Clinical Pharmacist [facility] [phone] 06/28/2022 1:19 PM

## 2022-06-29 ENCOUNTER — Other Ambulatory Visit: Payer: Self-pay

## 2022-06-29 NOTE — Telephone Encounter (Addendum)
Received notification from [facility] that medication was approved to be covered through Medicare part B through 09/18/23.   Test claim ran, zero copay.

## 2022-06-30 ENCOUNTER — Other Ambulatory Visit: Payer: Self-pay

## 2022-07-04 ENCOUNTER — Other Ambulatory Visit (HOSPITAL_COMMUNITY): Payer: Self-pay

## 2022-07-04 NOTE — Telephone Encounter (Signed)
Medication set up to deliver to patient 10/19

## 2022-07-05 ENCOUNTER — Other Ambulatory Visit (HOSPITAL_COMMUNITY): Payer: Self-pay

## 2022-07-06 ENCOUNTER — Inpatient Hospital Stay (HOSPITAL_BASED_OUTPATIENT_CLINIC_OR_DEPARTMENT_OTHER): Payer: Medicare HMO

## 2022-07-06 DIAGNOSIS — C187 Malignant neoplasm of sigmoid colon: Secondary | ICD-10-CM

## 2022-07-06 NOTE — Progress Notes (Signed)
Oral Chemotherapy Pharmacist Encounter  I spoke with patient for overview of: Xeloda (capecitabine) for the  treatment of colon cancer, planned duration until disease progression or unacceptable drug toxicity.  Counseled patient on administration, dosing, side effects, monitoring, drug-food interactions, safe handling, storage, and disposal.  Patient will take Xeloda '500mg'$  tablets, 2 tablets ('1000mg'$ ) by mouth in AM and 3 tabs ('1500mg'$ ) by mouth in PM, within 30 minutes of finishing meals, for 14 days on, 7 days off, repeated every 21 days.  Xeloda start date: 07/07/2022  Adverse effects include but are not limited to: fatigue, decreased blood counts, GI upset, diarrhea, mouth sores, and hand-foot syndrome.  Patient has anti-emetic on hand and knows to take it if nausea develops.   Patient will obtain anti diarrheal and alert the office of 4 or more loose stools above baseline.  Reviewed with patient importance of keeping a medication schedule and plan for any missed doses. No barriers to medication adherence identified.  Medication reconciliation performed and medication/allergy list updated.  This will ship from the [facility] on 07/05/22 to deliver to patient's home on 07/06/22.  Patient informed the pharmacy will reach out 5-7 days prior to needing next fill of Xeloda to coordinate continued medication acquisition to prevent break in therapy.  All questions answered.  Mrs. Amick voiced understanding and appreciation.   Medication education handout placed in mail for patient. Patient knows to call the office with questions or concerns. Oral Chemotherapy Clinic phone number provided to patient. ]  Patient aware of follow up with Dr. Federico Flake on 11/2 which will be day 66 of therapy in the first cycle.   Drema Halon, PharmD Hematology/Oncology Clinical Pharmacist [facility] [phone]

## 2022-07-07 ENCOUNTER — Other Ambulatory Visit (HOSPITAL_COMMUNITY): Payer: Self-pay

## 2022-07-11 ENCOUNTER — Telehealth: Payer: Self-pay

## 2022-07-11 ENCOUNTER — Inpatient Hospital Stay (INDEPENDENT_AMBULATORY_CARE_PROVIDER_SITE_OTHER): Payer: Medicare HMO | Admitting: Oncology

## 2022-07-11 ENCOUNTER — Inpatient Hospital Stay: Payer: Medicare HMO

## 2022-07-11 ENCOUNTER — Other Ambulatory Visit (HOSPITAL_COMMUNITY): Payer: Self-pay

## 2022-07-11 VITALS — BP 121/73 | HR 113 | Temp 98.2°F | Resp 18 | Ht 63.6 in | Wt 88.6 lb

## 2022-07-11 DIAGNOSIS — Z7189 Other specified counseling: Secondary | ICD-10-CM | POA: Insufficient documentation

## 2022-07-11 DIAGNOSIS — C187 Malignant neoplasm of sigmoid colon: Secondary | ICD-10-CM

## 2022-07-11 DIAGNOSIS — J449 Chronic obstructive pulmonary disease, unspecified: Secondary | ICD-10-CM

## 2022-07-11 DIAGNOSIS — R636 Underweight: Secondary | ICD-10-CM

## 2022-07-11 DIAGNOSIS — Z09 Encounter for follow-up examination after completed treatment for conditions other than malignant neoplasm: Secondary | ICD-10-CM

## 2022-07-11 NOTE — Telephone Encounter (Signed)
Gabriel Rung spoke with Dr Federico Flake regarding below. He wants to see her in clinic today. Pt called, & notified of Dr Jeralyn Bennett request to see her. Appt's given and scheduled.  Drema Halon, RPH: Judi just called me to inform me that she had trouble breathing yesterday from her COPD and later she then felt very fatigued and had a fever last night (unable to tell me the exact temp). She did not take the xeloda last night. She still feels fatigued this morning although she is breathing better and the fever is gone. I noticed Dr. Federico Flake is do not disturb on chat- can one of you two ask him if he would like her to take this mornings pills? (Btw started last Friday and has had no other problems)
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
## 2022-07-13 ENCOUNTER — Ambulatory Visit: Payer: Medicare HMO | Admitting: Dietician
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
## 2022-07-17 ENCOUNTER — Telehealth: Payer: Self-pay

## 2022-07-17 NOTE — Telephone Encounter (Signed)
Attempted call to pt to see how she is tolerating the Xeloda.

## 2022-07-18 ENCOUNTER — Encounter: Payer: Self-pay | Admitting: Oncology

## 2022-07-18 ENCOUNTER — Other Ambulatory Visit (HOSPITAL_COMMUNITY): Payer: Self-pay
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
## 2022-07-20 ENCOUNTER — Inpatient Hospital Stay: Payer: Medicare HMO | Attending: Oncology | Admitting: Oncology

## 2022-07-20 ENCOUNTER — Encounter: Payer: Self-pay | Admitting: Oncology

## 2022-07-20 ENCOUNTER — Inpatient Hospital Stay: Payer: Medicare HMO

## 2022-07-20 VITALS — BP 107/57 | HR 84 | Temp 98.0°F | Resp 16 | Ht 63.6 in | Wt 95.1 lb

## 2022-07-20 DIAGNOSIS — Z7189 Other specified counseling: Secondary | ICD-10-CM

## 2022-07-20 DIAGNOSIS — Z90710 Acquired absence of both cervix and uterus: Secondary | ICD-10-CM | POA: Diagnosis not present

## 2022-07-20 DIAGNOSIS — C187 Malignant neoplasm of sigmoid colon: Secondary | ICD-10-CM

## 2022-07-20 DIAGNOSIS — C19 Malignant neoplasm of rectosigmoid junction: Secondary | ICD-10-CM | POA: Insufficient documentation

## 2022-07-20 DIAGNOSIS — I7300 Raynaud's syndrome without gangrene: Secondary | ICD-10-CM | POA: Diagnosis not present

## 2022-07-20 DIAGNOSIS — J441 Chronic obstructive pulmonary disease with (acute) exacerbation: Secondary | ICD-10-CM | POA: Diagnosis not present

## 2022-07-20 DIAGNOSIS — Z09 Encounter for follow-up examination after completed treatment for conditions other than malignant neoplasm: Secondary | ICD-10-CM | POA: Diagnosis not present

## 2022-07-20 DIAGNOSIS — I2510 Atherosclerotic heart disease of native coronary artery without angina pectoris: Secondary | ICD-10-CM | POA: Diagnosis not present

## 2022-07-20 DIAGNOSIS — Z87891 Personal history of nicotine dependence: Secondary | ICD-10-CM | POA: Diagnosis not present

## 2022-07-20 DIAGNOSIS — G629 Polyneuropathy, unspecified: Secondary | ICD-10-CM | POA: Diagnosis not present

## 2022-07-20 DIAGNOSIS — F419 Anxiety disorder, unspecified: Secondary | ICD-10-CM | POA: Diagnosis not present

## 2022-07-20 DIAGNOSIS — I10 Essential (primary) hypertension: Secondary | ICD-10-CM | POA: Insufficient documentation

## 2022-07-20 LAB — COMPREHENSIVE METABOLIC PANEL
ALT: 13 U/L (ref 0–44)
AST: 18 U/L (ref 15–41)
Albumin: 4.2 g/dL (ref 3.5–5.0)
Alkaline Phosphatase: 67 U/L (ref 38–126)
Anion gap: 9 (ref 5–15)
BUN: 15 mg/dL (ref 8–23)
CO2: 30 mmol/L (ref 22–32)
Calcium: 9.5 mg/dL (ref 8.9–10.3)
Chloride: 101 mmol/L (ref 98–111)
Creatinine, Ser: 0.61 mg/dL (ref 0.44–1.00)
GFR, Estimated: >60 mL/min (ref >60)
Glucose, Bld: 105 mg/dL — ABNORMAL HIGH (ref 70–99)
Potassium: 4.3 mmol/L (ref 3.5–5.1)
Sodium: 140 mmol/L (ref 135–145)
Total Bilirubin: 0.3 mg/dL (ref 0.3–1.2)
Total Protein: 8.2 g/dL — ABNORMAL HIGH (ref 6.5–8.1)

## 2022-07-20 LAB — CBC WITH DIFFERENTIAL/PLATELET
Abs Immature Granulocytes: 0.03 10*3/uL (ref 0.00–0.07)
Basophils Absolute: 0.1 10*3/uL (ref 0.0–0.1)
Basophils Relative: 1 %
Eosinophils Absolute: 0.6 10*3/uL — ABNORMAL HIGH (ref 0.0–0.5)
Eosinophils Relative: 8 %
HCT: 39.3 % (ref 36.0–46.0)
Hemoglobin: 12.1 g/dL (ref 12.0–15.0)
Immature Granulocytes: 0 %
Lymphocytes Relative: 14 %
Lymphs Abs: 1.1 10*3/uL (ref 0.7–4.0)
MCH: 28.9 pg (ref 26.0–34.0)
MCHC: 30.8 g/dL (ref 30.0–36.0)
MCV: 93.8 fL (ref 80.0–100.0)
Monocytes Absolute: 0.7 10*3/uL (ref 0.1–1.0)
Monocytes Relative: 9 %
Neutro Abs: 5.2 10*3/uL (ref 1.7–7.7)
Neutrophils Relative %: 68 %
Platelets: 303 10*3/uL (ref 150–400)
RBC: 4.19 MIL/uL (ref 3.87–5.11)
RDW: 14.5 % (ref 11.5–15.5)
WBC: 7.6 10*3/uL (ref 4.0–10.5)
nRBC: 0 % (ref 0.0–0.2)

## 2022-07-20 NOTE — Telephone Encounter (Signed)
07/20/22 Next appt scheduled and confirmed with patient

## 2022-07-21 ENCOUNTER — Other Ambulatory Visit: Payer: Self-pay

## 2022-07-25 ENCOUNTER — Other Ambulatory Visit: Payer: Self-pay

## 2022-07-25 ENCOUNTER — Other Ambulatory Visit (HOSPITAL_COMMUNITY): Payer: Self-pay

## 2022-07-27 ENCOUNTER — Telehealth: Payer: Self-pay

## 2022-07-27 ENCOUNTER — Other Ambulatory Visit: Payer: Self-pay

## 2022-07-27 NOTE — Telephone Encounter (Signed)
Attempted call to pt, to see how she is doing with the Xeloda, no answer

## 2022-07-31 ENCOUNTER — Encounter: Payer: Self-pay | Admitting: Oncology

## 2022-07-31 NOTE — Telephone Encounter (Signed)
Pt returned my call this morning, 07/31/2022. Pt is taking the Xeloda @ 10am, and 7pm w/food. She denies missed doses. She denies N/V, diarrhea, fevers, mouth sores, hand/foot syndrome, skin rash/itching, cough, and chest pain. No change in baseline SOB (has COPD). She has constipation as baseline and takes MOM PRN. I told her to make sure she doesn't go more than 2 days without a BM. I also reminded her to call us if she develops temp of 100.4 or higher, day or night. She verbalized understanding.

## 2022-08-04 ENCOUNTER — Telehealth: Payer: Self-pay

## 2022-08-04 NOTE — Telephone Encounter (Addendum)
Pt returned my call. She is still taking 2 tabs Xeloda @ 10am, and 3 tabs @  7pm w/food. She started this dose pack on 07/28/2022. She denies missed doses. She denies N/V, diarrhea, fevers, mouth sores, hand/foot syndrome, skin rash/itching, cough, and chest pain. No change in baseline SOB (has COPD). She has constipation as baseline and takes MOM PRN.  I also reminded her to call us if she develops temp of 100.4 or higher, day or night. She verbalized understanding. I confirmed next appt w/pt.

## 2022-08-09 ENCOUNTER — Other Ambulatory Visit (HOSPITAL_COMMUNITY): Payer: Self-pay

## 2022-08-14 ENCOUNTER — Inpatient Hospital Stay: Payer: Medicare HMO

## 2022-08-14 ENCOUNTER — Inpatient Hospital Stay (INDEPENDENT_AMBULATORY_CARE_PROVIDER_SITE_OTHER): Payer: Medicare HMO | Admitting: Oncology

## 2022-08-14 ENCOUNTER — Encounter: Payer: Self-pay | Admitting: Oncology

## 2022-08-14 ENCOUNTER — Other Ambulatory Visit (HOSPITAL_COMMUNITY): Payer: Self-pay

## 2022-08-14 VITALS — BP 106/59 | HR 88 | Temp 97.6°F | Resp 18 | Ht 63.6 in | Wt 95.1 lb

## 2022-08-14 DIAGNOSIS — I7300 Raynaud's syndrome without gangrene: Secondary | ICD-10-CM

## 2022-08-14 DIAGNOSIS — C187 Malignant neoplasm of sigmoid colon: Secondary | ICD-10-CM | POA: Diagnosis not present

## 2022-08-14 DIAGNOSIS — Z09 Encounter for follow-up examination after completed treatment for conditions other than malignant neoplasm: Secondary | ICD-10-CM

## 2022-08-14 DIAGNOSIS — C19 Malignant neoplasm of rectosigmoid junction: Secondary | ICD-10-CM | POA: Diagnosis not present

## 2022-08-14 LAB — CBC WITH DIFFERENTIAL/PLATELET
Abs Immature Granulocytes: 0.02 10*3/uL (ref 0.00–0.07)
Basophils Absolute: 0.1 10*3/uL (ref 0.0–0.1)
Basophils Relative: 1 %
Eosinophils Absolute: 0.5 10*3/uL (ref 0.0–0.5)
Eosinophils Relative: 6 %
HCT: 45.4 % (ref 36.0–46.0)
Hemoglobin: 14.4 g/dL (ref 12.0–15.0)
Immature Granulocytes: 0 %
Lymphocytes Relative: 13 %
Lymphs Abs: 1 10*3/uL (ref 0.7–4.0)
MCH: 29.4 pg (ref 26.0–34.0)
MCHC: 31.7 g/dL (ref 30.0–36.0)
MCV: 92.8 fL (ref 80.0–100.0)
Monocytes Absolute: 0.7 10*3/uL (ref 0.1–1.0)
Monocytes Relative: 8 %
Neutro Abs: 5.9 10*3/uL (ref 1.7–7.7)
Neutrophils Relative %: 72 %
Platelets: 232 10*3/uL (ref 150–400)
RBC: 4.89 MIL/uL (ref 3.87–5.11)
RDW: 17.5 % — ABNORMAL HIGH (ref 11.5–15.5)
WBC: 8.2 10*3/uL (ref 4.0–10.5)
nRBC: 0 % (ref 0.0–0.2)

## 2022-08-14 LAB — COMPREHENSIVE METABOLIC PANEL
ALT: 13 U/L (ref 0–44)
AST: 21 U/L (ref 15–41)
Albumin: 4.7 g/dL (ref 3.5–5.0)
Alkaline Phosphatase: 75 U/L (ref 38–126)
Anion gap: 9 (ref 5–15)
BUN: 17 mg/dL (ref 8–23)
CO2: 29 mmol/L (ref 22–32)
Calcium: 9.7 mg/dL (ref 8.9–10.3)
Chloride: 98 mmol/L (ref 98–111)
Creatinine, Ser: 0.46 mg/dL (ref 0.44–1.00)
GFR, Estimated: >60 mL/min (ref >60)
Glucose, Bld: 107 mg/dL — ABNORMAL HIGH (ref 70–99)
Potassium: 4.3 mmol/L (ref 3.5–5.1)
Sodium: 136 mmol/L (ref 135–145)
Total Bilirubin: 0.6 mg/dL (ref 0.3–1.2)
Total Protein: 9 g/dL — ABNORMAL HIGH (ref 6.5–8.1)

## 2022-08-14 NOTE — Addendum Note (Signed)
Addended by: Wanita Chamberlain on: 08/14/2022 10:24 AM   Modules accepted: Orders
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
## 2022-08-15 ENCOUNTER — Other Ambulatory Visit: Payer: Self-pay

## 2022-08-17 ENCOUNTER — Other Ambulatory Visit: Payer: Self-pay

## 2022-08-22 ENCOUNTER — Encounter: Payer: Medicare HMO | Admitting: Dietician

## 2022-08-23 ENCOUNTER — Telehealth: Payer: Self-pay

## 2022-08-23 ENCOUNTER — Other Ambulatory Visit: Payer: Self-pay

## 2022-08-23 NOTE — Telephone Encounter (Signed)
I spoke with Mindy Gould. She states, "I'm doing great". She continues to take her Xeloda @ 10am, and the 3 tabs @ 7-8pm w/food. She missed one afternoon dose this past Sat night, as she fell asleep before taking it. She denies N/V, mouth sores, fever, diarrhea, and skin changes to hands and feet. I reminded pt to call us if she develops temp of 100.4 or higher, day or night. She voiced understanding.

## 2022-08-29 ENCOUNTER — Other Ambulatory Visit (HOSPITAL_COMMUNITY): Payer: Self-pay

## 2022-08-31 ENCOUNTER — Other Ambulatory Visit (HOSPITAL_COMMUNITY): Payer: Self-pay

## 2022-09-01 ENCOUNTER — Other Ambulatory Visit: Payer: Self-pay

## 2022-09-04 ENCOUNTER — Inpatient Hospital Stay: Payer: Medicare HMO | Attending: Oncology | Admitting: Oncology

## 2022-09-04 ENCOUNTER — Telehealth: Payer: Self-pay | Admitting: Oncology

## 2022-09-04 ENCOUNTER — Other Ambulatory Visit: Payer: Self-pay

## 2022-09-04 ENCOUNTER — Inpatient Hospital Stay: Payer: Medicare HMO

## 2022-09-04 VITALS — BP 97/62 | HR 98 | Temp 97.9°F | Resp 14 | Ht 63.6 in | Wt 97.9 lb

## 2022-09-04 DIAGNOSIS — Z90710 Acquired absence of both cervix and uterus: Secondary | ICD-10-CM | POA: Insufficient documentation

## 2022-09-04 DIAGNOSIS — I2510 Atherosclerotic heart disease of native coronary artery without angina pectoris: Secondary | ICD-10-CM | POA: Insufficient documentation

## 2022-09-04 DIAGNOSIS — C19 Malignant neoplasm of rectosigmoid junction: Secondary | ICD-10-CM | POA: Insufficient documentation

## 2022-09-04 DIAGNOSIS — Z87891 Personal history of nicotine dependence: Secondary | ICD-10-CM | POA: Diagnosis not present

## 2022-09-04 DIAGNOSIS — C187 Malignant neoplasm of sigmoid colon: Secondary | ICD-10-CM

## 2022-09-04 DIAGNOSIS — G629 Polyneuropathy, unspecified: Secondary | ICD-10-CM | POA: Diagnosis not present

## 2022-09-04 DIAGNOSIS — I10 Essential (primary) hypertension: Secondary | ICD-10-CM | POA: Insufficient documentation

## 2022-09-04 LAB — CBC WITH DIFFERENTIAL/PLATELET
Abs Immature Granulocytes: 0.02 10*3/uL (ref 0.00–0.07)
Basophils Absolute: 0.1 10*3/uL (ref 0.0–0.1)
Basophils Relative: 1 %
Eosinophils Absolute: 0.3 10*3/uL (ref 0.0–0.5)
Eosinophils Relative: 4 %
HCT: 43.6 % (ref 36.0–46.0)
Hemoglobin: 14.1 g/dL (ref 12.0–15.0)
Immature Granulocytes: 0 %
Lymphocytes Relative: 17 %
Lymphs Abs: 1.2 10*3/uL (ref 0.7–4.0)
MCH: 30.3 pg (ref 26.0–34.0)
MCHC: 32.3 g/dL (ref 30.0–36.0)
MCV: 93.8 fL (ref 80.0–100.0)
Monocytes Absolute: 0.6 10*3/uL (ref 0.1–1.0)
Monocytes Relative: 8 %
Neutro Abs: 5 10*3/uL (ref 1.7–7.7)
Neutrophils Relative %: 70 %
Platelets: 208 10*3/uL (ref 150–400)
RBC: 4.65 MIL/uL (ref 3.87–5.11)
RDW: 19.2 % — ABNORMAL HIGH (ref 11.5–15.5)
WBC: 7.1 10*3/uL (ref 4.0–10.5)
nRBC: 0 % (ref 0.0–0.2)

## 2022-09-04 LAB — CMP (CANCER CENTER ONLY)
ALT: 14 U/L (ref 0–44)
AST: 18 U/L (ref 15–41)
Albumin: 4.9 g/dL (ref 3.5–5.0)
Alkaline Phosphatase: 76 U/L (ref 38–126)
Anion gap: 9 (ref 5–15)
BUN: 21 mg/dL (ref 8–23)
CO2: 28 mmol/L (ref 22–32)
Calcium: 9.7 mg/dL (ref 8.9–10.3)
Chloride: 100 mmol/L (ref 98–111)
Creatinine: 0.7 mg/dL (ref 0.44–1.00)
GFR, Estimated: >60 mL/min (ref >60)
Glucose, Bld: 99 mg/dL (ref 70–99)
Potassium: 3.5 mmol/L (ref 3.5–5.1)
Sodium: 137 mmol/L (ref 135–145)
Total Bilirubin: 0.4 mg/dL (ref 0.3–1.2)
Total Protein: 8.6 g/dL — ABNORMAL HIGH (ref 6.5–8.1)
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
## 2022-09-04 NOTE — Telephone Encounter (Signed)
09/04/22 next appt scheduled and confirmed with patient

## 2022-09-05 ENCOUNTER — Other Ambulatory Visit: Payer: Self-pay

## 2022-09-05 ENCOUNTER — Ambulatory Visit: Payer: Medicare HMO | Admitting: Dietician
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
## 2022-09-06 ENCOUNTER — Other Ambulatory Visit: Payer: Self-pay

## 2022-09-12 ENCOUNTER — Encounter: Payer: Self-pay | Admitting: Oncology

## 2022-09-14 ENCOUNTER — Other Ambulatory Visit: Payer: Self-pay

## 2022-09-19 ENCOUNTER — Other Ambulatory Visit (HOSPITAL_COMMUNITY): Payer: Self-pay

## 2022-09-21 ENCOUNTER — Other Ambulatory Visit: Payer: Self-pay

## 2022-09-21 ENCOUNTER — Other Ambulatory Visit (HOSPITAL_COMMUNITY): Payer: Self-pay

## 2022-09-21 ENCOUNTER — Other Ambulatory Visit: Payer: Self-pay | Admitting: Oncology

## 2022-09-21 DIAGNOSIS — C187 Malignant neoplasm of sigmoid colon: Secondary | ICD-10-CM

## 2022-09-26 ENCOUNTER — Telehealth: Payer: Self-pay | Admitting: Oncology

## 2022-09-26 ENCOUNTER — Other Ambulatory Visit (HOSPITAL_COMMUNITY): Payer: Self-pay

## 2022-09-26 ENCOUNTER — Inpatient Hospital Stay: Payer: Medicare HMO

## 2022-09-26 ENCOUNTER — Inpatient Hospital Stay: Payer: Medicare HMO | Attending: Oncology | Admitting: Oncology

## 2022-09-26 ENCOUNTER — Other Ambulatory Visit: Payer: Self-pay | Admitting: Oncology

## 2022-09-26 ENCOUNTER — Other Ambulatory Visit: Payer: Self-pay

## 2022-09-26 VITALS — BP 129/75 | HR 76 | Temp 98.2°F | Resp 14 | Ht 63.6 in | Wt 101.5 lb

## 2022-09-26 DIAGNOSIS — C19 Malignant neoplasm of rectosigmoid junction: Secondary | ICD-10-CM | POA: Insufficient documentation

## 2022-09-26 DIAGNOSIS — R636 Underweight: Secondary | ICD-10-CM

## 2022-09-26 DIAGNOSIS — I10 Essential (primary) hypertension: Secondary | ICD-10-CM | POA: Diagnosis not present

## 2022-09-26 DIAGNOSIS — L271 Localized skin eruption due to drugs and medicaments taken internally: Secondary | ICD-10-CM

## 2022-09-26 DIAGNOSIS — Z90710 Acquired absence of both cervix and uterus: Secondary | ICD-10-CM | POA: Diagnosis not present

## 2022-09-26 DIAGNOSIS — I2510 Atherosclerotic heart disease of native coronary artery without angina pectoris: Secondary | ICD-10-CM | POA: Diagnosis not present

## 2022-09-26 DIAGNOSIS — C187 Malignant neoplasm of sigmoid colon: Secondary | ICD-10-CM

## 2022-09-26 DIAGNOSIS — G629 Polyneuropathy, unspecified: Secondary | ICD-10-CM | POA: Diagnosis not present

## 2022-09-26 DIAGNOSIS — Z87891 Personal history of nicotine dependence: Secondary | ICD-10-CM | POA: Diagnosis not present

## 2022-09-26 DIAGNOSIS — Z09 Encounter for follow-up examination after completed treatment for conditions other than malignant neoplasm: Secondary | ICD-10-CM

## 2022-09-26 LAB — COMPREHENSIVE METABOLIC PANEL
ALT: 13 U/L (ref 0–44)
AST: 19 U/L (ref 15–41)
Albumin: 4.7 g/dL (ref 3.5–5.0)
Alkaline Phosphatase: 69 U/L (ref 38–126)
Anion gap: 9 (ref 5–15)
BUN: 14 mg/dL (ref 8–23)
CO2: 28 mmol/L (ref 22–32)
Calcium: 9.2 mg/dL (ref 8.9–10.3)
Chloride: 103 mmol/L (ref 98–111)
Creatinine, Ser: 0.7 mg/dL (ref 0.44–1.00)
GFR, Estimated: >60 mL/min (ref >60)
Glucose, Bld: 87 mg/dL (ref 70–99)
Potassium: 4.3 mmol/L (ref 3.5–5.1)
Sodium: 140 mmol/L (ref 135–145)
Total Bilirubin: 0.5 mg/dL (ref 0.3–1.2)
Total Protein: 8 g/dL (ref 6.5–8.1)

## 2022-09-26 LAB — CBC WITH DIFFERENTIAL/PLATELET
Abs Immature Granulocytes: 0.06 10*3/uL (ref 0.00–0.07)
Basophils Absolute: 0 10*3/uL (ref 0.0–0.1)
Basophils Relative: 1 %
Eosinophils Absolute: 0.4 10*3/uL (ref 0.0–0.5)
Eosinophils Relative: 7 %
HCT: 38.4 % (ref 36.0–46.0)
Hemoglobin: 12.5 g/dL (ref 12.0–15.0)
Immature Granulocytes: 1 %
Lymphocytes Relative: 17 %
Lymphs Abs: 1 10*3/uL (ref 0.7–4.0)
MCH: 31.6 pg (ref 26.0–34.0)
MCHC: 32.6 g/dL (ref 30.0–36.0)
MCV: 97.2 fL (ref 80.0–100.0)
Monocytes Absolute: 0.6 10*3/uL (ref 0.1–1.0)
Monocytes Relative: 9 %
Neutro Abs: 3.9 10*3/uL (ref 1.7–7.7)
Neutrophils Relative %: 65 %
Platelets: 221 10*3/uL (ref 150–400)
RBC: 3.95 MIL/uL (ref 3.87–5.11)
RDW: 21.1 % — ABNORMAL HIGH (ref 11.5–15.5)
WBC: 6 10*3/uL (ref 4.0–10.5)
nRBC: 0 % (ref 0.0–0.2)

## 2022-09-26 MED ORDER — CAPECITABINE 500 MG PO TABS
ORAL_TABLET | ORAL | 3 refills | Status: DC
  Filled 2022-09-26: qty 70, 21d supply, fill #0
  Filled 2022-10-18: qty 70, 21d supply, fill #1
  Filled 2022-11-09: qty 70, 21d supply, fill #2
  Filled 2022-11-28: qty 70, 21d supply, fill #3
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
## 2022-09-26 NOTE — Telephone Encounter (Signed)
09/26/22 Next appt scheduled and confirmed with patient

## 2022-09-27 ENCOUNTER — Other Ambulatory Visit (HOSPITAL_COMMUNITY): Payer: Self-pay

## 2022-09-27 ENCOUNTER — Other Ambulatory Visit: Payer: Self-pay

## 2022-09-28 ENCOUNTER — Encounter: Payer: Self-pay | Admitting: Oncology

## 2022-09-28 ENCOUNTER — Ambulatory Visit: Payer: Medicare HMO | Admitting: Dietician

## 2022-09-28 DIAGNOSIS — L271 Localized skin eruption due to drugs and medicaments taken internally: Secondary | ICD-10-CM | POA: Insufficient documentation
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
## 2022-10-02 ENCOUNTER — Encounter: Payer: Self-pay | Admitting: Oncology

## 2022-10-06 ENCOUNTER — Telehealth: Payer: Self-pay

## 2022-10-06 NOTE — Telephone Encounter (Signed)
Pt will start 5th cycle Xeloda today, 10/06/2022. I will forward this message to Dr Federico Flake as an Juluis Rainier.

## 2022-10-09 ENCOUNTER — Other Ambulatory Visit: Payer: Self-pay

## 2022-10-09 ENCOUNTER — Other Ambulatory Visit (HOSPITAL_COMMUNITY): Payer: Self-pay

## 2022-10-17 ENCOUNTER — Telehealth: Payer: Self-pay | Admitting: Oncology

## 2022-10-17 ENCOUNTER — Inpatient Hospital Stay: Payer: Medicare HMO

## 2022-10-17 ENCOUNTER — Inpatient Hospital Stay (INDEPENDENT_AMBULATORY_CARE_PROVIDER_SITE_OTHER): Payer: Medicare HMO | Admitting: Oncology

## 2022-10-17 VITALS — BP 125/76 | HR 79 | Resp 20 | Ht 63.6 in | Wt 96.4 lb

## 2022-10-17 DIAGNOSIS — C187 Malignant neoplasm of sigmoid colon: Secondary | ICD-10-CM | POA: Diagnosis not present

## 2022-10-17 DIAGNOSIS — L271 Localized skin eruption due to drugs and medicaments taken internally: Secondary | ICD-10-CM

## 2022-10-17 DIAGNOSIS — Z09 Encounter for follow-up examination after completed treatment for conditions other than malignant neoplasm: Secondary | ICD-10-CM | POA: Diagnosis not present

## 2022-10-17 DIAGNOSIS — C19 Malignant neoplasm of rectosigmoid junction: Secondary | ICD-10-CM | POA: Diagnosis not present

## 2022-10-17 LAB — CBC WITH DIFFERENTIAL/PLATELET
Abs Immature Granulocytes: 0.03 10*3/uL (ref 0.00–0.07)
Basophils Absolute: 0.1 10*3/uL (ref 0.0–0.1)
Basophils Relative: 1 %
Eosinophils Absolute: 0.4 10*3/uL (ref 0.0–0.5)
Eosinophils Relative: 5 %
HCT: 43.5 % (ref 36.0–46.0)
Hemoglobin: 14.3 g/dL (ref 12.0–15.0)
Immature Granulocytes: 0 %
Lymphocytes Relative: 14 %
Lymphs Abs: 1.2 10*3/uL (ref 0.7–4.0)
MCH: 31.7 pg (ref 26.0–34.0)
MCHC: 32.9 g/dL (ref 30.0–36.0)
MCV: 96.5 fL (ref 80.0–100.0)
Monocytes Absolute: 0.8 10*3/uL (ref 0.1–1.0)
Monocytes Relative: 9 %
Neutro Abs: 6 10*3/uL (ref 1.7–7.7)
Neutrophils Relative %: 71 %
Platelets: 257 10*3/uL (ref 150–400)
RBC: 4.51 MIL/uL (ref 3.87–5.11)
RDW: 19.2 % — ABNORMAL HIGH (ref 11.5–15.5)
WBC: 8.3 10*3/uL (ref 4.0–10.5)
nRBC: 0 % (ref 0.0–0.2)

## 2022-10-17 LAB — COMPREHENSIVE METABOLIC PANEL
ALT: 10 U/L (ref 0–44)
AST: 20 U/L (ref 15–41)
Albumin: 4.7 g/dL (ref 3.5–5.0)
Alkaline Phosphatase: 77 U/L (ref 38–126)
Anion gap: 11 (ref 5–15)
BUN: 20 mg/dL (ref 8–23)
CO2: 27 mmol/L (ref 22–32)
Calcium: 9.2 mg/dL (ref 8.9–10.3)
Chloride: 98 mmol/L (ref 98–111)
Creatinine, Ser: 0.77 mg/dL (ref 0.44–1.00)
GFR, Estimated: >60 mL/min (ref >60)
Glucose, Bld: 98 mg/dL (ref 70–99)
Potassium: 4 mmol/L (ref 3.5–5.1)
Sodium: 136 mmol/L (ref 135–145)
Total Bilirubin: 0.7 mg/dL (ref 0.3–1.2)
Total Protein: 8.9 g/dL — ABNORMAL HIGH (ref 6.5–8.1)

## 2022-10-17 NOTE — Telephone Encounter (Signed)
Patient has been scheduled for follow-up visit per 10/17/22 LOS.  Pt given an appt calendar with date and time.
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
## 2022-10-18 ENCOUNTER — Other Ambulatory Visit: Payer: Self-pay

## 2022-10-18 ENCOUNTER — Other Ambulatory Visit (HOSPITAL_COMMUNITY): Payer: Self-pay

## 2022-10-19 ENCOUNTER — Other Ambulatory Visit: Payer: Self-pay

## 2022-10-20 ENCOUNTER — Other Ambulatory Visit (HOSPITAL_COMMUNITY): Payer: Self-pay

## 2022-10-20 ENCOUNTER — Encounter: Payer: Self-pay | Admitting: Oncology

## 2022-10-22 ENCOUNTER — Other Ambulatory Visit: Payer: Self-pay

## 2022-11-05 ENCOUNTER — Other Ambulatory Visit: Payer: Self-pay

## 2022-11-07 ENCOUNTER — Other Ambulatory Visit: Payer: Medicare HMO

## 2022-11-07 ENCOUNTER — Ambulatory Visit: Payer: Medicare HMO | Admitting: Oncology

## 2022-11-07 ENCOUNTER — Other Ambulatory Visit (HOSPITAL_COMMUNITY): Payer: Self-pay

## 2022-11-09 ENCOUNTER — Inpatient Hospital Stay (INDEPENDENT_AMBULATORY_CARE_PROVIDER_SITE_OTHER): Payer: Medicare HMO | Admitting: Oncology

## 2022-11-09 ENCOUNTER — Telehealth: Payer: Self-pay | Admitting: Oncology

## 2022-11-09 ENCOUNTER — Inpatient Hospital Stay: Payer: Medicare HMO | Attending: Oncology

## 2022-11-09 ENCOUNTER — Other Ambulatory Visit (HOSPITAL_COMMUNITY): Payer: Self-pay

## 2022-11-09 VITALS — BP 108/60 | HR 84 | Temp 98.5°F | Resp 12 | Ht 63.6 in | Wt 96.4 lb

## 2022-11-09 DIAGNOSIS — L271 Localized skin eruption due to drugs and medicaments taken internally: Secondary | ICD-10-CM | POA: Diagnosis not present

## 2022-11-09 DIAGNOSIS — I2510 Atherosclerotic heart disease of native coronary artery without angina pectoris: Secondary | ICD-10-CM | POA: Insufficient documentation

## 2022-11-09 DIAGNOSIS — Z87891 Personal history of nicotine dependence: Secondary | ICD-10-CM | POA: Diagnosis not present

## 2022-11-09 DIAGNOSIS — G629 Polyneuropathy, unspecified: Secondary | ICD-10-CM | POA: Insufficient documentation

## 2022-11-09 DIAGNOSIS — C19 Malignant neoplasm of rectosigmoid junction: Secondary | ICD-10-CM | POA: Insufficient documentation

## 2022-11-09 DIAGNOSIS — Z09 Encounter for follow-up examination after completed treatment for conditions other than malignant neoplasm: Secondary | ICD-10-CM

## 2022-11-09 DIAGNOSIS — C187 Malignant neoplasm of sigmoid colon: Secondary | ICD-10-CM

## 2022-11-09 LAB — CBC WITH DIFFERENTIAL (CANCER CENTER ONLY)
Abs Immature Granulocytes: 0.02 10*3/uL (ref 0.00–0.07)
Basophils Absolute: 0.1 10*3/uL (ref 0.0–0.1)
Basophils Relative: 1 %
Eosinophils Absolute: 0.3 10*3/uL (ref 0.0–0.5)
Eosinophils Relative: 4 %
HCT: 39.5 % (ref 36.0–46.0)
Hemoglobin: 13 g/dL (ref 12.0–15.0)
Immature Granulocytes: 0 %
Lymphocytes Relative: 16 %
Lymphs Abs: 1.1 10*3/uL (ref 0.7–4.0)
MCH: 33.8 pg (ref 26.0–34.0)
MCHC: 32.9 g/dL (ref 30.0–36.0)
MCV: 102.6 fL — ABNORMAL HIGH (ref 80.0–100.0)
Monocytes Absolute: 0.5 10*3/uL (ref 0.1–1.0)
Monocytes Relative: 7 %
Neutro Abs: 5.3 10*3/uL (ref 1.7–7.7)
Neutrophils Relative %: 72 %
Platelet Count: 200 10*3/uL (ref 150–400)
RBC: 3.85 MIL/uL — ABNORMAL LOW (ref 3.87–5.11)
RDW: 16.4 % — ABNORMAL HIGH (ref 11.5–15.5)
WBC Count: 7.3 10*3/uL (ref 4.0–10.5)
nRBC: 0 % (ref 0.0–0.2)

## 2022-11-09 LAB — CMP (CANCER CENTER ONLY)
ALT: 17 U/L (ref 0–44)
AST: 29 U/L (ref 15–41)
Albumin: 4.2 g/dL (ref 3.5–5.0)
Alkaline Phosphatase: 64 U/L (ref 38–126)
Anion gap: 10 (ref 5–15)
BUN: 16 mg/dL (ref 8–23)
CO2: 27 mmol/L (ref 22–32)
Calcium: 9.2 mg/dL (ref 8.9–10.3)
Chloride: 99 mmol/L (ref 98–111)
Creatinine: 0.85 mg/dL (ref 0.44–1.00)
GFR, Estimated: >60 mL/min (ref >60)
Glucose, Bld: 99 mg/dL (ref 70–99)
Potassium: 4 mmol/L (ref 3.5–5.1)
Sodium: 136 mmol/L (ref 135–145)
Total Bilirubin: 0.5 mg/dL (ref 0.3–1.2)
Total Protein: 7.5 g/dL (ref 6.5–8.1)
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
## 2022-11-09 NOTE — Telephone Encounter (Signed)
11/09/22 Next appt scheduled and confirmed with patient 

## 2022-11-10 ENCOUNTER — Other Ambulatory Visit: Payer: Self-pay

## 2022-11-10 ENCOUNTER — Other Ambulatory Visit (HOSPITAL_COMMUNITY): Payer: Self-pay

## 2022-11-11 ENCOUNTER — Other Ambulatory Visit: Payer: Self-pay

## 2022-11-13 ENCOUNTER — Encounter: Payer: Self-pay | Admitting: Oncology

## 2022-11-16 ENCOUNTER — Inpatient Hospital Stay: Payer: Medicare HMO

## 2022-11-16 ENCOUNTER — Inpatient Hospital Stay (INDEPENDENT_AMBULATORY_CARE_PROVIDER_SITE_OTHER): Payer: Medicare HMO | Admitting: Oncology

## 2022-11-16 DIAGNOSIS — C187 Malignant neoplasm of sigmoid colon: Secondary | ICD-10-CM

## 2022-11-16 DIAGNOSIS — Z09 Encounter for follow-up examination after completed treatment for conditions other than malignant neoplasm: Secondary | ICD-10-CM | POA: Diagnosis not present

## 2022-11-16 DIAGNOSIS — L271 Localized skin eruption due to drugs and medicaments taken internally: Secondary | ICD-10-CM | POA: Diagnosis not present
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
## 2022-11-27 ENCOUNTER — Encounter: Payer: Self-pay | Admitting: Oncology

## 2022-11-28 ENCOUNTER — Other Ambulatory Visit (HOSPITAL_COMMUNITY): Payer: Self-pay

## 2022-11-30 ENCOUNTER — Other Ambulatory Visit (HOSPITAL_COMMUNITY): Payer: Self-pay

## 2022-12-19 ENCOUNTER — Other Ambulatory Visit: Payer: Self-pay

## 2022-12-22 ENCOUNTER — Telehealth: Payer: Self-pay | Admitting: Oncology

## 2022-12-22 NOTE — Telephone Encounter (Signed)
12/22/22 Spoke with patient and will call her with next appt

## 2022-12-26 ENCOUNTER — Other Ambulatory Visit: Payer: Self-pay

## 2022-12-27 ENCOUNTER — Inpatient Hospital Stay: Payer: Medicare HMO | Attending: Oncology | Admitting: Oncology

## 2022-12-27 ENCOUNTER — Inpatient Hospital Stay: Payer: Medicare HMO

## 2022-12-27 VITALS — BP 128/70 | HR 94 | Temp 98.3°F | Resp 18 | Ht 63.6 in | Wt 96.1 lb

## 2022-12-27 DIAGNOSIS — C19 Malignant neoplasm of rectosigmoid junction: Secondary | ICD-10-CM | POA: Diagnosis present

## 2022-12-27 DIAGNOSIS — L271 Localized skin eruption due to drugs and medicaments taken internally: Secondary | ICD-10-CM

## 2022-12-27 DIAGNOSIS — C187 Malignant neoplasm of sigmoid colon: Secondary | ICD-10-CM

## 2022-12-27 DIAGNOSIS — Z09 Encounter for follow-up examination after completed treatment for conditions other than malignant neoplasm: Secondary | ICD-10-CM

## 2022-12-27 DIAGNOSIS — G629 Polyneuropathy, unspecified: Secondary | ICD-10-CM | POA: Insufficient documentation

## 2022-12-27 LAB — COMPREHENSIVE METABOLIC PANEL
ALT: 12 U/L (ref 0–44)
AST: 21 U/L (ref 15–41)
Albumin: 4.5 g/dL (ref 3.5–5.0)
Alkaline Phosphatase: 80 U/L (ref 38–126)
Anion gap: 10 (ref 5–15)
BUN: 15 mg/dL (ref 8–23)
CO2: 29 mmol/L (ref 22–32)
Calcium: 9.6 mg/dL (ref 8.9–10.3)
Chloride: 97 mmol/L — ABNORMAL LOW (ref 98–111)
Creatinine, Ser: 0.69 mg/dL (ref 0.44–1.00)
GFR, Estimated: >60 mL/min (ref >60)
Glucose, Bld: 83 mg/dL (ref 70–99)
Potassium: 4.4 mmol/L (ref 3.5–5.1)
Sodium: 136 mmol/L (ref 135–145)
Total Bilirubin: 0.2 mg/dL — ABNORMAL LOW (ref 0.3–1.2)
Total Protein: 9.1 g/dL — ABNORMAL HIGH (ref 6.5–8.1)

## 2022-12-27 LAB — CBC WITH DIFFERENTIAL/PLATELET
Abs Immature Granulocytes: 0.01 10*3/uL (ref 0.00–0.07)
Basophils Absolute: 0.1 10*3/uL (ref 0.0–0.1)
Basophils Relative: 1 %
Eosinophils Absolute: 0.3 10*3/uL (ref 0.0–0.5)
Eosinophils Relative: 4 %
HCT: 45.3 % (ref 36.0–46.0)
Hemoglobin: 14.9 g/dL (ref 12.0–15.0)
Immature Granulocytes: 0 %
Lymphocytes Relative: 18 %
Lymphs Abs: 1.3 10*3/uL (ref 0.7–4.0)
MCH: 32.5 pg (ref 26.0–34.0)
MCHC: 32.9 g/dL (ref 30.0–36.0)
MCV: 98.9 fL (ref 80.0–100.0)
Monocytes Absolute: 0.5 10*3/uL (ref 0.1–1.0)
Monocytes Relative: 7 %
Neutro Abs: 5.2 10*3/uL (ref 1.7–7.7)
Neutrophils Relative %: 70 %
Platelets: 258 10*3/uL (ref 150–400)
RBC: 4.58 MIL/uL (ref 3.87–5.11)
RDW: 12.6 % (ref 11.5–15.5)
WBC: 7.4 10*3/uL (ref 4.0–10.5)
nRBC: 0 % (ref 0.0–0.2)
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
## 2022-12-29 ENCOUNTER — Other Ambulatory Visit: Payer: Self-pay

## 2022-12-29 LAB — CEA: CEA: 5.8 ng/mL — ABNORMAL HIGH (ref 0.0–4.7)

## 2023-03-08 ENCOUNTER — Telehealth: Payer: Self-pay | Admitting: Oncology

## 2023-03-08 NOTE — Telephone Encounter (Signed)
CT C/A/P has been scheduled for 03/19/23 @ 9 am; Checking in @ 8am   Notified pt of date,time and instructions.

## 2023-03-17 ENCOUNTER — Other Ambulatory Visit: Payer: Self-pay

## 2023-03-26 ENCOUNTER — Ambulatory Visit: Payer: Medicare HMO | Admitting: Oncology

## 2023-04-09 DIAGNOSIS — K819 Cholecystitis, unspecified: Secondary | ICD-10-CM | POA: Insufficient documentation

## 2023-04-18 ENCOUNTER — Encounter: Payer: Self-pay | Admitting: Cardiology

## 2023-04-18 ENCOUNTER — Ambulatory Visit: Payer: Medicare HMO | Attending: Cardiology | Admitting: Cardiology

## 2023-04-18 VITALS — BP 116/70 | HR 91 | Ht 64.5 in | Wt 90.0 lb

## 2023-04-18 DIAGNOSIS — I25118 Atherosclerotic heart disease of native coronary artery with other forms of angina pectoris: Secondary | ICD-10-CM | POA: Diagnosis not present

## 2023-04-18 DIAGNOSIS — R079 Chest pain, unspecified: Secondary | ICD-10-CM

## 2023-04-18 DIAGNOSIS — J449 Chronic obstructive pulmonary disease, unspecified: Secondary | ICD-10-CM

## 2023-04-18 DIAGNOSIS — C189 Malignant neoplasm of colon, unspecified: Secondary | ICD-10-CM

## 2023-04-18 DIAGNOSIS — R0609 Other forms of dyspnea: Secondary | ICD-10-CM

## 2023-04-18 DIAGNOSIS — I739 Peripheral vascular disease, unspecified: Secondary | ICD-10-CM | POA: Diagnosis not present

## 2023-04-18 DIAGNOSIS — I10 Essential (primary) hypertension: Secondary | ICD-10-CM

## 2023-04-18 DIAGNOSIS — I2510 Atherosclerotic heart disease of native coronary artery without angina pectoris: Secondary | ICD-10-CM | POA: Insufficient documentation

## 2023-04-18 MED ORDER — ASPIRIN 81 MG PO TBEC: 81.0000 mg | DELAYED_RELEASE_TABLET | Freq: Every day | ORAL | Status: DC

## 2023-04-18 MED ORDER — ATORVASTATIN CALCIUM 10 MG PO TABS: 10.0000 mg | ORAL_TABLET | Freq: Every day | ORAL | 3 refills | Status: DC

## 2023-04-18 NOTE — Addendum Note (Signed)
Addended by: Baldo Ash D on: 04/18/2023 10:10 AM   Modules accepted: Orders
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
## 2023-04-18 NOTE — Patient Instructions (Addendum)
Medication Instructions:   START: Aspirin 81mg  enteric coated 1 tablet daily  START: Lipitor 10mg  1 tablet daily   Lab Work: Your physician recommends that you return for lab work in: 2 weeks  You need to have labs done when you are fasting.  You can come Monday through Friday 8:30 am to 12:00 pm and 1:15 to 4:30. You do not need to make an appointment as the order has already been placed. The labs you are going to have done are Lipids, AST, ALT, CBC.    Testing/Procedures: Your physician has requested that you have an echocardiogram. Echocardiography is a painless test that uses sound waves to create images of your heart. It provides your doctor with information about the size and shape of your heart and how well your heart's chambers and valves are working. This procedure takes approximately one hour. There are no restrictions for this procedure. Please do NOT wear cologne, perfume, aftershave, or lotions (deodorant is allowed). Please arrive 15 minutes prior to your appointment time.  Your physician has requested that you have a lexiscan myoview. For further information please visit [email or web address] Please follow instruction sheet, as given.  The test will take approximately 3 to 4 hours to complete; you may bring reading material.  If someone comes with you to your appointment, they will need to remain in the main lobby due to limited space in the testing area.  How to prepare for your Myocardial Perfusion Test: Do not eat or drink 3 hours prior to your test, except you may have water. Do not consume products containing caffeine (regular or decaffeinated) 12 hours prior to your test. (ex: coffee, chocolate, sodas, tea). Do bring a list of your current medications with you.  If not listed below, you may take your medications as normal. Do wear comfortable clothes (no dresses or overalls) and walking shoes, tennis shoes preferred (No heels or open toe shoes are allowed). Do NOT wear  cologne, perfume, aftershave, or lotions (deodorant is allowed). If these instructions are not followed, your test will have to be rescheduled.      Follow-Up: At [facility], you and your health needs are our priority.  As part of our continuing mission to provide you with exceptional heart care, we have created designated Provider Care Teams.  These Care Teams include your primary Cardiologist (physician) and Advanced Practice Providers (APPs -  Physician Assistants and Nurse Practitioners) who all work together to provide you with the care you need, when you need it.  We recommend signing up for the patient portal called "MyChart".  Sign up information is provided on this After Visit Summary.  MyChart is used to connect with patients for Virtual Visits (Telemedicine).  Patients are able to view lab/test results, encounter notes, upcoming appointments, etc.  Non-urgent messages can be sent to your provider as well.   To learn more about what you can do with MyChart, go to [email or web address].    Your next appointment:   2 month(s)  The format for your next appointment:   In Person  Provider:   Gypsy Balsam, MD    Other Instructions NA

## 2023-04-18 NOTE — Addendum Note (Signed)
Addended by: Baldo Ash D on: 04/18/2023 09:48 AM   Modules accepted: Orders

## 2023-04-19 ENCOUNTER — Ambulatory Visit: Payer: Medicare HMO | Attending: Cardiology

## 2023-04-19 ENCOUNTER — Other Ambulatory Visit: Payer: Self-pay

## 2023-04-19 DIAGNOSIS — R079 Chest pain, unspecified: Secondary | ICD-10-CM

## 2023-04-19 LAB — MYOCARDIAL PERFUSION IMAGING
LV dias vol: 42 mL (ref 46–106)
LV sys vol: 10 mL
Nuc Stress EF: 76 %
Peak HR: 107 {beats}/min
Rest HR: 95 {beats}/min
Rest Nuclear Isotope Dose: 10.8 mCi
SDS: 1
SRS: 8
SSS: 9
Stress Nuclear Isotope Dose: 31 mCi
TID: 0.8

## 2023-04-19 MED ORDER — TECHNETIUM TC 99M TETROFOSMIN IV KIT
10.8000 | PACK | Freq: Once | INTRAVENOUS | Status: AC | PRN
  Administered 2023-04-19: 10.8 via INTRAVENOUS

## 2023-04-19 MED ORDER — TECHNETIUM TC 99M TETROFOSMIN IV KIT
31.0000 | PACK | Freq: Once | INTRAVENOUS | Status: AC | PRN
  Administered 2023-04-19: 31 via INTRAVENOUS

## 2023-04-19 MED ORDER — REGADENOSON 0.4 MG/5ML IV SOLN
0.4000 mg | Freq: Once | INTRAVENOUS | Status: AC
  Administered 2023-04-19: 0.4 mg via INTRAVENOUS

## 2023-04-26 NOTE — Addendum Note (Signed)
Addended by: Gypsy Balsam on: 04/26/2023 09:30 AM   Modules accepted: Orders

## 2023-05-02 ENCOUNTER — Inpatient Hospital Stay: Payer: Medicare HMO

## 2023-05-02 ENCOUNTER — Telehealth: Payer: Self-pay

## 2023-05-02 ENCOUNTER — Inpatient Hospital Stay: Payer: Medicare HMO | Attending: Oncology | Admitting: Oncology

## 2023-05-02 VITALS — BP 148/70 | HR 93 | Temp 99.2°F | Resp 16 | Ht 64.5 in | Wt 88.4 lb

## 2023-05-02 DIAGNOSIS — K828 Other specified diseases of gallbladder: Secondary | ICD-10-CM

## 2023-05-02 DIAGNOSIS — C187 Malignant neoplasm of sigmoid colon: Secondary | ICD-10-CM | POA: Diagnosis not present

## 2023-05-02 DIAGNOSIS — I2510 Atherosclerotic heart disease of native coronary artery without angina pectoris: Secondary | ICD-10-CM | POA: Insufficient documentation

## 2023-05-02 DIAGNOSIS — G629 Polyneuropathy, unspecified: Secondary | ICD-10-CM | POA: Diagnosis not present

## 2023-05-02 DIAGNOSIS — Z09 Encounter for follow-up examination after completed treatment for conditions other than malignant neoplasm: Secondary | ICD-10-CM

## 2023-05-02 DIAGNOSIS — I25118 Atherosclerotic heart disease of native coronary artery with other forms of angina pectoris: Secondary | ICD-10-CM | POA: Diagnosis not present

## 2023-05-02 DIAGNOSIS — Z87891 Personal history of nicotine dependence: Secondary | ICD-10-CM | POA: Diagnosis not present

## 2023-05-02 DIAGNOSIS — R0602 Shortness of breath: Secondary | ICD-10-CM | POA: Diagnosis not present

## 2023-05-02 DIAGNOSIS — C19 Malignant neoplasm of rectosigmoid junction: Secondary | ICD-10-CM | POA: Diagnosis present

## 2023-05-02 DIAGNOSIS — I7300 Raynaud's syndrome without gangrene: Secondary | ICD-10-CM | POA: Insufficient documentation

## 2023-05-02 LAB — COMPREHENSIVE METABOLIC PANEL
ALT: 13 U/L (ref 0–44)
AST: 18 U/L (ref 15–41)
Albumin: 3.7 g/dL (ref 3.5–5.0)
Alkaline Phosphatase: 71 U/L (ref 38–126)
Anion gap: 9 (ref 5–15)
BUN: 7 mg/dL — ABNORMAL LOW (ref 8–23)
CO2: 29 mmol/L (ref 22–32)
Calcium: 8.8 mg/dL — ABNORMAL LOW (ref 8.9–10.3)
Chloride: 99 mmol/L (ref 98–111)
Creatinine, Ser: 0.57 mg/dL (ref 0.44–1.00)
GFR, Estimated: >60 mL/min (ref >60)
Glucose, Bld: 108 mg/dL — ABNORMAL HIGH (ref 70–99)
Potassium: 4.4 mmol/L (ref 3.5–5.1)
Sodium: 137 mmol/L (ref 135–145)
Total Bilirubin: 0.2 mg/dL — ABNORMAL LOW (ref 0.3–1.2)
Total Protein: 7.8 g/dL (ref 6.5–8.1)

## 2023-05-02 LAB — CBC WITH DIFFERENTIAL/PLATELET
Abs Immature Granulocytes: 0.05 10*3/uL (ref 0.00–0.07)
Basophils Absolute: 0.1 10*3/uL (ref 0.0–0.1)
Basophils Relative: 0 %
Eosinophils Absolute: 0.2 10*3/uL (ref 0.0–0.5)
Eosinophils Relative: 1 %
HCT: 39.5 % (ref 36.0–46.0)
Hemoglobin: 12.3 g/dL (ref 12.0–15.0)
Immature Granulocytes: 0 %
Lymphocytes Relative: 18 %
Lymphs Abs: 2.3 10*3/uL (ref 0.7–4.0)
MCH: 27.5 pg (ref 26.0–34.0)
MCHC: 31.1 g/dL (ref 30.0–36.0)
MCV: 88.2 fL (ref 80.0–100.0)
Monocytes Absolute: 1.1 10*3/uL — ABNORMAL HIGH (ref 0.1–1.0)
Monocytes Relative: 9 %
Neutro Abs: 9.2 10*3/uL — ABNORMAL HIGH (ref 1.7–7.7)
Neutrophils Relative %: 72 %
Platelets: 322 10*3/uL (ref 150–400)
RBC: 4.48 MIL/uL (ref 3.87–5.11)
RDW: 14.9 % (ref 11.5–15.5)
WBC: 12.9 10*3/uL — ABNORMAL HIGH (ref 4.0–10.5)
nRBC: 0 % (ref 0.0–0.2)

## 2023-05-02 LAB — FERRITIN: Ferritin: 10 ng/mL — ABNORMAL LOW (ref 11–307)
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
## 2023-05-02 NOTE — Telephone Encounter (Signed)
Patient notified of results. Awaiting to here for Dr. Kirtland Bouchard response on when to schedule her.

## 2023-05-02 NOTE — Telephone Encounter (Signed)
-----   Message from Gypsy Balsam sent at 04/23/2023  8:49 AM EDT -----  Stress test showed mild ischemia.  Need to have a follow-up to discuss this

## 2023-05-03 ENCOUNTER — Telehealth: Payer: Self-pay

## 2023-05-03 ENCOUNTER — Encounter: Payer: Self-pay | Admitting: Cardiology

## 2023-05-03 ENCOUNTER — Ambulatory Visit: Payer: Medicare HMO | Attending: Cardiology | Admitting: Cardiology

## 2023-05-03 VITALS — BP 140/82 | HR 89 | Ht 64.5 in | Wt 87.6 lb

## 2023-05-03 DIAGNOSIS — I25118 Atherosclerotic heart disease of native coronary artery with other forms of angina pectoris: Secondary | ICD-10-CM

## 2023-05-03 DIAGNOSIS — E785 Hyperlipidemia, unspecified: Secondary | ICD-10-CM

## 2023-05-03 DIAGNOSIS — J449 Chronic obstructive pulmonary disease, unspecified: Secondary | ICD-10-CM

## 2023-05-03 DIAGNOSIS — I10 Essential (primary) hypertension: Secondary | ICD-10-CM | POA: Diagnosis not present

## 2023-05-03 DIAGNOSIS — C189 Malignant neoplasm of colon, unspecified: Secondary | ICD-10-CM | POA: Diagnosis not present

## 2023-05-03 MED ORDER — METOPROLOL TARTRATE 25 MG PO TABS: 12.5000 mg | ORAL_TABLET | Freq: Two times a day (BID) | ORAL | 3 refills | Status: DC

## 2023-05-03 NOTE — Telephone Encounter (Signed)
Patient schedule today at 1:20pm

## 2023-05-03 NOTE — Addendum Note (Signed)
Addended by: Baldo Ash D on: 05/03/2023 01:53 PM   Modules accepted: Orders

## 2023-05-03 NOTE — Telephone Encounter (Signed)
-----   Message from Gypsy Balsam sent at 05/02/2023  3:40 PM EDT ----- No, she cannot wait to have her echo done and then I can see her ----- Message ----- From: Heywood Bene, CMA Sent: 05/02/2023  11:28 AM EDT To: Georgeanna Lea, MD  Patient has her Echo scheduled on 08/22, do you want to see her before Echo? ----- Message ----- From: Georgeanna Lea, MD Sent: 04/23/2023   8:49 AM EDT To: Neena Rhymes, RN   Stress test showed mild ischemia.  Need to have a follow-up to discuss this

## 2023-05-03 NOTE — Patient Instructions (Signed)
Medication Instructions:   START: Metoprolol 12.5mg  daily   Lab Work: Lipid, AST, ALT- today If you have labs (blood work) drawn today and your tests are completely normal, you will receive your results only by: MyChart Message (if you have MyChart) OR A paper copy in the mail If you have any lab test that is abnormal or we need to change your treatment, we will call you to review the results.   Testing/Procedures: None Ordered   Follow-Up: At [facility], you and your health needs are our priority.  As part of our continuing mission to provide you with exceptional heart care, we have created designated Provider Care Teams.  These Care Teams include your primary Cardiologist (physician) and Advanced Practice Providers (APPs -  Physician Assistants and Nurse Practitioners) who all work together to provide you with the care you need, when you need it.  We recommend signing up for the patient portal called "MyChart".  Sign up information is provided on this After Visit Summary.  MyChart is used to connect with patients for Virtual Visits (Telemedicine).  Patients are able to view lab/test results, encounter notes, upcoming appointments, etc.  Non-urgent messages can be sent to your provider as well.   To learn more about what you can do with MyChart, go to [email or web address].    Your next appointment:   3 month(s)  The format for your next appointment:   In Person  Provider:   Gypsy Balsam, MD    Other Instructions NA
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
## 2023-05-05 ENCOUNTER — Other Ambulatory Visit: Payer: Self-pay

## 2023-05-05 LAB — CEA: CEA: 3.9 ng/mL (ref 0.0–4.7)

## 2023-05-10 ENCOUNTER — Ambulatory Visit: Payer: Medicare HMO

## 2023-05-10 DIAGNOSIS — R0609 Other forms of dyspnea: Secondary | ICD-10-CM

## 2023-05-10 LAB — ECHOCARDIOGRAM COMPLETE: S' Lateral: 2 cm

## 2023-05-22 ENCOUNTER — Encounter: Payer: Self-pay | Admitting: Oncology

## 2023-05-23 DIAGNOSIS — Z85038 Personal history of other malignant neoplasm of large intestine: Secondary | ICD-10-CM | POA: Insufficient documentation

## 2023-05-24 ENCOUNTER — Telehealth: Payer: Self-pay | Admitting: *Deleted

## 2023-05-24 NOTE — Telephone Encounter (Signed)
   Pre-operative Risk Assessment    Patient Name: Mindy Gould  DOB: 01/19/1956 MRN: [id number]    DATE OF LAST VISIT: 05/03/23 DR. KRASOWSKI DATE OF NEXT VISIT: 08/07/23 DR. KRASOWSKI  Request for Surgical Clearance    Procedure:   COLONOSCOPY-REPEAT; PERSONAL H/O COLON CANCER  Date of Surgery:  Clearance TBD                                 Surgeon:  DR. Susann Givens Surgeon's Group or Practice Name:  [facility] Phone number:  [phone] Fax number:  [phone]   Type of Clearance Requested:   - Medical ; ASA INSTRUCTIONS TO HE HELD   Type of Anesthesia:   PROPOFOL   Additional requests/questions:    Elpidio Anis   05/24/2023, 8:46 AM

## 2023-05-24 NOTE — Telephone Encounter (Signed)
   This patient is pending repeat colonoscopy with date TBD. Recent stress test results with mild ischemia involving inferior wall and newly resumed ASA. Given this, are you okay with patient proceeding with colonoscopy and holding ASA x7 days? Please response to CV pre-op pool.  Thanks!  Perlie Gold, PA-C

## 2023-05-25 NOTE — Telephone Encounter (Signed)
   Patient Name: Mindy Gould  DOB: 14-Jul-1956 MRN: [id number]  Primary Cardiologist: None  Chart reviewed as part of pre-operative protocol coverage. Given past medical history and time since last visit, based on ACC/AHA guidelines, Mindy Gould is at acceptable risk for the planned procedure without further cardiovascular testing.   The patient was advised that if she develops new symptoms prior to surgery to contact our office to arrange for a follow-up visit, and she verbalized understanding.  Regarding ASA therapy, we recommend continuation of ASA throughout the perioperative period.  However, if the surgeon feels that cessation of ASA is required in the perioperative period, it may be stopped 5-7 days prior to surgery with a plan to resume it as soon as felt to be feasible from a surgical standpoint in the post-operative period.   I will route this recommendation to the requesting party via Epic fax function and remove from pre-op pool.  Please call with questions.  Joni Reining, NP 05/25/2023, 1:09 PM

## 2023-05-29 ENCOUNTER — Telehealth: Payer: Self-pay

## 2023-05-29 NOTE — Telephone Encounter (Signed)
Left message on My Chart with normal Echo results per Dr. Krasowski's note. Routed to PCP.  

## 2023-06-12 ENCOUNTER — Ambulatory Visit: Payer: Medicare HMO | Admitting: Cardiology

## 2023-07-10 ENCOUNTER — Inpatient Hospital Stay: Payer: Medicare HMO | Admitting: Oncology

## 2023-07-12 ENCOUNTER — Inpatient Hospital Stay: Payer: Medicare HMO | Attending: Oncology | Admitting: Oncology

## 2023-07-12 VITALS — BP 121/79 | HR 80 | Temp 97.7°F | Resp 18 | Ht 64.5 in | Wt 86.5 lb

## 2023-07-12 DIAGNOSIS — I7300 Raynaud's syndrome without gangrene: Secondary | ICD-10-CM | POA: Diagnosis not present

## 2023-07-12 DIAGNOSIS — K828 Other specified diseases of gallbladder: Secondary | ICD-10-CM | POA: Diagnosis not present

## 2023-07-12 DIAGNOSIS — C19 Malignant neoplasm of rectosigmoid junction: Secondary | ICD-10-CM | POA: Diagnosis present

## 2023-07-12 DIAGNOSIS — I2510 Atherosclerotic heart disease of native coronary artery without angina pectoris: Secondary | ICD-10-CM | POA: Diagnosis not present

## 2023-07-12 DIAGNOSIS — G629 Polyneuropathy, unspecified: Secondary | ICD-10-CM | POA: Insufficient documentation

## 2023-07-12 DIAGNOSIS — C187 Malignant neoplasm of sigmoid colon: Secondary | ICD-10-CM

## 2023-07-12 DIAGNOSIS — K8309 Other cholangitis: Secondary | ICD-10-CM

## 2023-07-12 DIAGNOSIS — Z5189 Encounter for other specified aftercare: Secondary | ICD-10-CM | POA: Diagnosis not present

## 2023-07-12 DIAGNOSIS — Z87891 Personal history of nicotine dependence: Secondary | ICD-10-CM | POA: Insufficient documentation

## 2023-07-12 LAB — CBC WITH DIFFERENTIAL/PLATELET
Abs Immature Granulocytes: 0.03 10*3/uL (ref 0.00–0.07)
Basophils Absolute: 0.1 10*3/uL (ref 0.0–0.1)
Basophils Relative: 1 %
Eosinophils Absolute: 0.4 10*3/uL (ref 0.0–0.5)
Eosinophils Relative: 5 %
HCT: 43.5 % (ref 36.0–46.0)
Hemoglobin: 13.7 g/dL (ref 12.0–15.0)
Immature Granulocytes: 0 %
Lymphocytes Relative: 28 %
Lymphs Abs: 2.3 10*3/uL (ref 0.7–4.0)
MCH: 28.7 pg (ref 26.0–34.0)
MCHC: 31.5 g/dL (ref 30.0–36.0)
MCV: 91.2 fL (ref 80.0–100.0)
Monocytes Absolute: 0.8 10*3/uL (ref 0.1–1.0)
Monocytes Relative: 9 %
Neutro Abs: 4.8 10*3/uL (ref 1.7–7.7)
Neutrophils Relative %: 57 %
Platelets: 279 10*3/uL (ref 150–400)
RBC: 4.77 MIL/uL (ref 3.87–5.11)
RDW: 14.9 % (ref 11.5–15.5)
WBC: 8.3 10*3/uL (ref 4.0–10.5)
nRBC: 0 % (ref 0.0–0.2)

## 2023-07-12 LAB — COMPREHENSIVE METABOLIC PANEL
ALT: 16 U/L (ref 0–44)
AST: 19 U/L (ref 15–41)
Albumin: 4.3 g/dL (ref 3.5–5.0)
Alkaline Phosphatase: 83 U/L (ref 38–126)
Anion gap: 9 (ref 5–15)
BUN: 14 mg/dL (ref 8–23)
CO2: 29 mmol/L (ref 22–32)
Calcium: 9.3 mg/dL (ref 8.9–10.3)
Chloride: 96 mmol/L — ABNORMAL LOW (ref 98–111)
Creatinine, Ser: 0.62 mg/dL (ref 0.44–1.00)
GFR, Estimated: >60 mL/min (ref >60)
Glucose, Bld: 81 mg/dL (ref 70–99)
Potassium: 4.6 mmol/L (ref 3.5–5.1)
Sodium: 134 mmol/L — ABNORMAL LOW (ref 135–145)
Total Bilirubin: 0.3 mg/dL (ref 0.3–1.2)
Total Protein: 8.5 g/dL — ABNORMAL HIGH (ref 6.5–8.1)

## 2023-07-12 LAB — FERRITIN: Ferritin: 17 ng/mL (ref 11–307)
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
## 2023-07-13 LAB — CEA: CEA: 4.9 ng/mL — ABNORMAL HIGH (ref 0.0–4.7)

## 2023-07-14 ENCOUNTER — Other Ambulatory Visit: Payer: Self-pay

## 2023-07-22 ENCOUNTER — Other Ambulatory Visit: Payer: Self-pay

## 2023-07-26 ENCOUNTER — Encounter: Payer: Self-pay | Admitting: Oncology

## 2023-07-26 DIAGNOSIS — Z5189 Encounter for other specified aftercare: Secondary | ICD-10-CM | POA: Insufficient documentation

## 2023-07-26 DIAGNOSIS — K8309 Other cholangitis: Secondary | ICD-10-CM | POA: Insufficient documentation

## 2023-08-07 ENCOUNTER — Ambulatory Visit: Payer: Medicare HMO | Attending: Cardiology | Admitting: Cardiology

## 2023-08-07 ENCOUNTER — Encounter: Payer: Self-pay | Admitting: Cardiology

## 2023-08-07 VITALS — BP 110/70 | HR 102 | Ht 64.5 in | Wt 86.2 lb

## 2023-08-07 DIAGNOSIS — E785 Hyperlipidemia, unspecified: Secondary | ICD-10-CM | POA: Diagnosis not present

## 2023-08-07 DIAGNOSIS — I25118 Atherosclerotic heart disease of native coronary artery with other forms of angina pectoris: Secondary | ICD-10-CM | POA: Diagnosis not present

## 2023-08-07 DIAGNOSIS — C189 Malignant neoplasm of colon, unspecified: Secondary | ICD-10-CM | POA: Diagnosis not present

## 2023-08-07 DIAGNOSIS — I10 Essential (primary) hypertension: Secondary | ICD-10-CM | POA: Diagnosis not present

## 2023-08-07 NOTE — Patient Instructions (Signed)
Medication Instructions:  Your physician recommends that you continue on your current medications as directed. Please refer to the Current Medication list given to you today.  *If you need a refill on your cardiac medications before your next appointment, please call your pharmacy*   Lab Work: Your physician recommends that you return for lab work in: when fasting You need to have labs done when you are fasting.  You can come Monday through Friday 8:30 am to 12:00 pm and 1:15 to 4:30. You do not need to make an appointment as the order has already been placed. The labs you are going to have done are AST, LFT and Lipids.    Testing/Procedures: None Ordered   Follow-Up: At [facility], you and your health needs are our priority.  As part of our continuing mission to provide you with exceptional heart care, we have created designated Provider Care Teams.  These Care Teams include your primary Cardiologist (physician) and Advanced Practice Providers (APPs -  Physician Assistants and Nurse Practitioners) who all work together to provide you with the care you need, when you need it.  We recommend signing up for the patient portal called "MyChart".  Sign up information is provided on this After Visit Summary.  MyChart is used to connect with patients for Virtual Visits (Telemedicine).  Patients are able to view lab/test results, encounter notes, upcoming appointments, etc.  Non-urgent messages can be sent to your provider as well.   To learn more about what you can do with MyChart, go to [email or web address].    Your next appointment:   6 month(s)  The format for your next appointment:   In Person  Provider:   Gypsy Balsam, MD    Other Instructions NA
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
## 2023-10-04 ENCOUNTER — Other Ambulatory Visit: Payer: Self-pay

## 2023-10-12 ENCOUNTER — Ambulatory Visit: Payer: Medicare HMO | Admitting: Oncology

## 2023-10-12 ENCOUNTER — Other Ambulatory Visit: Payer: Medicare HMO

## 2023-10-15 ENCOUNTER — Other Ambulatory Visit: Payer: Self-pay | Admitting: Oncology

## 2023-10-15 DIAGNOSIS — C187 Malignant neoplasm of sigmoid colon: Secondary | ICD-10-CM

## 2023-10-15 NOTE — Progress Notes (Signed)
[facility]  [address] [place],  [place]  [address] [phone]  Clinic Day:  10/16/2023  Referring physician: Dois Davenport, MD   HISTORY OF PRESENT ILLNESS:  The patient is a 68 y.o. 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  She completed 8 cycles of adjuvant single-agent Xeloda In April 2024.  CT scans afterwards showed her to be disease free.  However, the patient has yet to undergo a postsurgical colonoscopy.  She comes in today for routine follow-up.  Overall, the patient is doing well.  She denies having any new GI symptoms which concern her for disease recurrence.    PHYSICAL EXAM:  Blood pressure 123/79, pulse (!) 107, temperature 98.2 F (36.8 C), temperature source Oral, resp. rate 16, height 5' 4.5" (1.638 m), weight 88 lb 4.8 oz (40.1 kg), SpO2 95%. Wt Readings from Last 3 Encounters:  10/16/23 88 lb 4.8 oz (40.1 kg)  08/07/23 86 lb 3.2 oz (39.1 kg)  07/12/23 86 lb 8 oz (39.2 kg)   Body mass index is 14.92 kg/m. Performance status (ECOG): 1 - Symptomatic but completely ambulatory Physical Exam Constitutional:      Appearance: Normal appearance. She is not ill-appearing.     Comments: Thin woman in no acute distress  HENT:     Mouth/Throat:     Mouth: Mucous membranes are moist.     Pharynx: Oropharynx is clear. No oropharyngeal exudate or posterior oropharyngeal erythema.  Cardiovascular:     Rate and Rhythm: Normal rate and regular rhythm.     Heart sounds: No murmur heard.    No friction rub. No gallop.  Pulmonary:     Effort: Pulmonary effort is normal. No respiratory distress.     Breath sounds: Normal breath sounds. No wheezing, rhonchi or rales.  Abdominal:     General: Bowel sounds are normal. There is no distension.     Palpations: Abdomen is soft. There is no mass.     Tenderness: There is no abdominal tenderness.  Musculoskeletal:        General:  No swelling.     Right lower leg: No edema.     Left lower leg: No edema.  Lymphadenopathy:     Cervical: No cervical adenopathy.     Upper Body:     Right upper body: No supraclavicular or axillary adenopathy.     Left upper body: No supraclavicular or axillary adenopathy.     Lower Body: No right inguinal adenopathy. No left inguinal adenopathy.  Skin:    General: Skin is warm.     Coloration: Skin is not jaundiced.     Findings: No lesion or rash.  Neurological:     General: No focal deficit present.     Mental Status: She is alert and oriented to person, place, and time. Mental status is at baseline.  Psychiatric:        Mood and Affect: Mood normal.        Behavior: Behavior normal.        Thought Content: Thought content normal.    LABS:      Latest Ref Rng & Units 10/16/2023    1:18 PM 07/12/2023    2:15 PM 05/02/2023    3:11 PM  CBC  WBC 4.0 - 10.5 K/uL 9.0  8.3  12.9   Hemoglobin 12.0 - 15.0 g/dL 13.0  86.5  78.4   Hematocrit 36.0 -  46.0 % 44.8  43.5  39.5   Platelets 150 - 400 K/uL 241  279  322       Latest Ref Rng & Units 10/16/2023    1:18 PM 07/12/2023    2:15 PM 05/02/2023    3:11 PM  CMP  Glucose 70 - 99 mg/dL 96  81  161   BUN 8 - 23 mg/dL 12  14  7    Creatinine 0.44 - 1.00 mg/dL 0.96  0.45  4.09   Sodium 135 - 145 mmol/L 144  134  137   Potassium 3.5 - 5.1 mmol/L 3.9  4.6  4.4   Chloride 98 - 111 mmol/L 103  96  99   CO2 22 - 32 mmol/L 30  29  29    Calcium 8.9 - 10.3 mg/dL 9.9  9.3  8.8   Total Protein 6.5 - 8.1 g/dL 8.2  8.5  7.8   Total Bilirubin 0.0 - 1.2 mg/dL 0.3  0.3  0.2   Alkaline Phos 38 - 126 U/L 100  83  71   AST 15 - 41 U/L 21  19  18    ALT 0 - 44 U/L 11  16  13        Component Value Date/Time   CEA1 4.9 (H) 07/12/2023 1415   CEA 5.14 (H) 10/16/2023 1318   ASSESSMENT & PLAN:  Assessment/Plan:  A 68 y.o. female with stage IIIB rectal cancer, status post a low anterior resection in August 2023.  Based upon her labs and physical exam  today, the patient remains disease-free.  As mentioned previously, she is yet to undergo a postsurgical colonoscopy to ensure the remaining aspects of her lower GI tract remain disease-free.  I will arrange for this procedure to be set up within the next few months.  Otherwise, I will see her back in 4 months for repeat clinical assessment.  The patient understands all the plans discussed today and is in agreement with them.    Adreyan Carbajal Kirby Funk, MD

## 2023-10-16 ENCOUNTER — Inpatient Hospital Stay (HOSPITAL_BASED_OUTPATIENT_CLINIC_OR_DEPARTMENT_OTHER): Payer: Medicare HMO | Admitting: Oncology

## 2023-10-16 ENCOUNTER — Inpatient Hospital Stay: Payer: Medicare HMO | Attending: Oncology

## 2023-10-16 ENCOUNTER — Ambulatory Visit: Payer: Medicare HMO | Admitting: Oncology

## 2023-10-16 ENCOUNTER — Other Ambulatory Visit: Payer: Self-pay | Admitting: Oncology

## 2023-10-16 ENCOUNTER — Other Ambulatory Visit: Payer: Medicare HMO

## 2023-10-16 ENCOUNTER — Encounter: Payer: Self-pay | Admitting: Oncology

## 2023-10-16 ENCOUNTER — Telehealth: Payer: Self-pay | Admitting: Oncology

## 2023-10-16 VITALS — BP 123/79 | HR 107 | Temp 98.2°F | Resp 16 | Ht 64.5 in | Wt 88.3 lb

## 2023-10-16 DIAGNOSIS — C187 Malignant neoplasm of sigmoid colon: Secondary | ICD-10-CM

## 2023-10-16 DIAGNOSIS — C188 Malignant neoplasm of overlapping sites of colon: Secondary | ICD-10-CM

## 2023-10-16 DIAGNOSIS — Z85048 Personal history of other malignant neoplasm of rectum, rectosigmoid junction, and anus: Secondary | ICD-10-CM | POA: Insufficient documentation

## 2023-10-16 LAB — CBC WITH DIFFERENTIAL (CANCER CENTER ONLY)
Abs Immature Granulocytes: 0.01 10*3/uL (ref 0.00–0.07)
Basophils Absolute: 0.1 10*3/uL (ref 0.0–0.1)
Basophils Relative: 1 %
Eosinophils Absolute: 0.2 10*3/uL (ref 0.0–0.5)
Eosinophils Relative: 2 %
HCT: 44.8 % (ref 36.0–46.0)
Hemoglobin: 14.7 g/dL (ref 12.0–15.0)
Immature Granulocytes: 0 %
Lymphocytes Relative: 18 %
Lymphs Abs: 1.7 10*3/uL (ref 0.7–4.0)
MCH: 29.2 pg (ref 26.0–34.0)
MCHC: 32.8 g/dL (ref 30.0–36.0)
MCV: 89.1 fL (ref 80.0–100.0)
Monocytes Absolute: 0.7 10*3/uL (ref 0.1–1.0)
Monocytes Relative: 7 %
Neutro Abs: 6.5 10*3/uL (ref 1.7–7.7)
Neutrophils Relative %: 72 %
Platelet Count: 241 10*3/uL (ref 150–400)
RBC: 5.03 MIL/uL (ref 3.87–5.11)
RDW: 12.7 % (ref 11.5–15.5)
WBC Count: 9 10*3/uL (ref 4.0–10.5)
nRBC: 0 % (ref 0.0–0.2)
nRBC: 0 /100{WBCs}

## 2023-10-16 LAB — CEA (ACCESS): CEA (CHCC): 5.14 ng/mL — ABNORMAL HIGH (ref 0.00–5.00)

## 2023-10-16 LAB — CMP (CANCER CENTER ONLY)
ALT: 11 U/L (ref 0–44)
AST: 21 U/L (ref 15–41)
Albumin: 4.6 g/dL (ref 3.5–5.0)
Alkaline Phosphatase: 100 U/L (ref 38–126)
Anion gap: 11 (ref 5–15)
BUN: 12 mg/dL (ref 8–23)
CO2: 30 mmol/L (ref 22–32)
Calcium: 9.9 mg/dL (ref 8.9–10.3)
Chloride: 103 mmol/L (ref 98–111)
Creatinine: 0.65 mg/dL (ref 0.44–1.00)
GFR, Estimated: >60 mL/min (ref >60)
Glucose, Bld: 96 mg/dL (ref 70–99)
Potassium: 3.9 mmol/L (ref 3.5–5.1)
Sodium: 144 mmol/L (ref 135–145)
Total Bilirubin: 0.3 mg/dL (ref 0.0–1.2)
Total Protein: 8.2 g/dL — ABNORMAL HIGH (ref 6.5–8.1)

## 2023-10-16 NOTE — Telephone Encounter (Signed)
10/16/23 Spoke with patient and confirmed next appts.

## 2023-10-17 ENCOUNTER — Other Ambulatory Visit: Payer: Self-pay

## 2023-10-22 ENCOUNTER — Encounter: Payer: Self-pay | Admitting: Oncology

## 2024-02-08 ENCOUNTER — Other Ambulatory Visit: Payer: Self-pay

## 2024-02-12 ENCOUNTER — Inpatient Hospital Stay: Payer: Medicare HMO

## 2024-02-14 ENCOUNTER — Ambulatory Visit: Payer: Medicare HMO | Admitting: Oncology

## 2024-02-26 ENCOUNTER — Inpatient Hospital Stay: Attending: Oncology

## 2024-02-26 DIAGNOSIS — R970 Elevated carcinoembryonic antigen [CEA]: Secondary | ICD-10-CM | POA: Diagnosis not present

## 2024-02-26 DIAGNOSIS — C187 Malignant neoplasm of sigmoid colon: Secondary | ICD-10-CM

## 2024-02-26 DIAGNOSIS — Z85048 Personal history of other malignant neoplasm of rectum, rectosigmoid junction, and anus: Secondary | ICD-10-CM | POA: Insufficient documentation

## 2024-02-26 LAB — CMP (CANCER CENTER ONLY)
ALT: 16 U/L (ref 0–44)
AST: 24 U/L (ref 15–41)
Albumin: 4.7 g/dL (ref 3.5–5.0)
Alkaline Phosphatase: 114 U/L (ref 38–126)
Anion gap: 11 (ref 5–15)
BUN: 12 mg/dL (ref 8–23)
CO2: 31 mmol/L (ref 22–32)
Calcium: 9.8 mg/dL (ref 8.9–10.3)
Chloride: 100 mmol/L (ref 98–111)
Creatinine: 0.74 mg/dL (ref 0.44–1.00)
GFR, Estimated: >60 mL/min (ref >60)
Glucose, Bld: 137 mg/dL — ABNORMAL HIGH (ref 70–99)
Potassium: 4.1 mmol/L (ref 3.5–5.1)
Sodium: 141 mmol/L (ref 135–145)
Total Bilirubin: 0.3 mg/dL (ref 0.0–1.2)
Total Protein: 8.1 g/dL (ref 6.5–8.1)

## 2024-02-26 LAB — CBC WITH DIFFERENTIAL (CANCER CENTER ONLY)
Abs Immature Granulocytes: 0.02 10*3/uL (ref 0.00–0.07)
Basophils Absolute: 0.1 10*3/uL (ref 0.0–0.1)
Basophils Relative: 1 %
Eosinophils Absolute: 0.3 10*3/uL (ref 0.0–0.5)
Eosinophils Relative: 2 %
HCT: 51.3 % — ABNORMAL HIGH (ref 36.0–46.0)
Hemoglobin: 16.3 g/dL — ABNORMAL HIGH (ref 12.0–15.0)
Immature Granulocytes: 0 %
Lymphocytes Relative: 13 %
Lymphs Abs: 1.5 10*3/uL (ref 0.7–4.0)
MCH: 28 pg (ref 26.0–34.0)
MCHC: 31.8 g/dL (ref 30.0–36.0)
MCV: 88.1 fL (ref 80.0–100.0)
Monocytes Absolute: 0.4 10*3/uL (ref 0.1–1.0)
Monocytes Relative: 4 %
Neutro Abs: 8.8 10*3/uL — ABNORMAL HIGH (ref 1.7–7.7)
Neutrophils Relative %: 80 %
Platelet Count: 219 10*3/uL (ref 150–400)
RBC: 5.82 MIL/uL — ABNORMAL HIGH (ref 3.87–5.11)
RDW: 14 % (ref 11.5–15.5)
WBC Count: 11.1 10*3/uL — ABNORMAL HIGH (ref 4.0–10.5)
nRBC: 0 % (ref 0.0–0.2)

## 2024-02-26 LAB — CEA (ACCESS): CEA (CHCC): 6.03 ng/mL — ABNORMAL HIGH (ref 0.00–5.00)

## 2024-02-27 NOTE — Progress Notes (Signed)
 [facility]  [address] [place],  [place]  [address] [phone]  Clinic Day:  02/28/2024  Referring physician: Allene Ivan, MD   HISTORY OF PRESENT ILLNESS:  The patient is a 68 y.o. 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  She completed 8 cycles of adjuvant single-agent Xeloda  In April 2024.  CT scans afterwards showed her to be disease free.  She comes in today for routine follow-up.  Since her last visit, the patient has been doing well.  She denies having any new GI symptoms which concern her for disease recurrence.  Of note, she is scheduled for a colonoscopy next week.  PHYSICAL EXAM:  Blood pressure (!) 145/88, pulse 86, temperature 98.3 F (36.8 C), temperature source Oral, resp. rate 18, height 5' 4.5 (1.638 m), weight 87 lb (39.5 kg), SpO2 92%. Wt Readings from Last 3 Encounters:  02/28/24 87 lb (39.5 kg)  10/16/23 88 lb 4.8 oz (40.1 kg)  08/07/23 86 lb 3.2 oz (39.1 kg)   Body mass index is 14.7 kg/m. Performance status (ECOG): 1 - Symptomatic but completely ambulatory Physical Exam Constitutional:      Appearance: Normal appearance. She is not ill-appearing.     Comments: Thin woman in no acute distress  HENT:     Mouth/Throat:     Mouth: Mucous membranes are moist.     Pharynx: Oropharynx is clear. No oropharyngeal exudate or posterior oropharyngeal erythema.   Cardiovascular:     Rate and Rhythm: Normal rate and regular rhythm.     Heart sounds: No murmur heard.    No friction rub. No gallop.  Pulmonary:     Effort: Pulmonary effort is normal. No respiratory distress.     Breath sounds: Normal breath sounds. No wheezing, rhonchi or rales.  Abdominal:     General: Bowel sounds are normal. There is no distension.     Palpations: Abdomen is soft. There is no mass.     Tenderness: There is no abdominal tenderness.   Musculoskeletal:        General: No  swelling.     Right lower leg: No edema.     Left lower leg: No edema.  Lymphadenopathy:     Cervical: No cervical adenopathy.     Upper Body:     Right upper body: No supraclavicular or axillary adenopathy.     Left upper body: No supraclavicular or axillary adenopathy.     Lower Body: No right inguinal adenopathy. No left inguinal adenopathy.   Skin:    General: Skin is warm.     Coloration: Skin is not jaundiced.     Findings: No lesion or rash.   Neurological:     General: No focal deficit present.     Mental Status: She is alert and oriented to person, place, and time. Mental status is at baseline.   Psychiatric:        Mood and Affect: Mood normal.        Behavior: Behavior normal.        Thought Content: Thought content normal.    LABS:      Latest Ref Rng & Units 02/26/2024    1:46 PM 10/16/2023    1:18 PM 07/12/2023    2:15 PM  CBC  WBC 4.0 - 10.5 K/uL 11.1  9.0  8.3   Hemoglobin 12.0 - 15.0 g/dL 91.4  78.2  13.7  Hematocrit 36.0 - 46.0 % 51.3  44.8  43.5   Platelets 150 - 400 K/uL 219  241  279       Latest Ref Rng & Units 02/26/2024    1:46 PM 10/16/2023    1:18 PM 07/12/2023    2:15 PM  CMP  Glucose 70 - 99 mg/dL 161  96  81   BUN 8 - 23 mg/dL 12  12  14    Creatinine 0.44 - 1.00 mg/dL 0.96  0.45  4.09   Sodium 135 - 145 mmol/L 141  144  134   Potassium 3.5 - 5.1 mmol/L 4.1  3.9  4.6   Chloride 98 - 111 mmol/L 100  103  96   CO2 22 - 32 mmol/L 31  30  29    Calcium  8.9 - 10.3 mg/dL 9.8  9.9  9.3   Total Protein 6.5 - 8.1 g/dL 8.1  8.2  8.5   Total Bilirubin 0.0 - 1.2 mg/dL 0.3  0.3  0.3   Alkaline Phos 38 - 126 U/L 114  100  83   AST 15 - 41 U/L 24  21  19    ALT 0 - 44 U/L 16  11  16        Component Value Date/Time   CEA1 4.9 (H) 07/12/2023 1415   CEA 6.03 (H) 02/26/2024 1346    Latest Reference Range & Units 05/02/23 15:11 07/12/23 14:15 10/16/23 13:18 02/26/24 13:46  CEA 0.0 - 4.7 ng/mL 3.9 4.9 (H)    CEA ([facility]) 0.00 - 5.00 ng/mL   5.14 (H)  6.03 (H)  (H): Data is abnormally high ASSESSMENT & PLAN:  Assessment/Plan:  A 68 y.o. female with stage IIIB rectal cancer, status post a low anterior resection in August 2023.  Although the patient is clinically doing well, I am concerned as her CEA level continues to gradually rise.  Although she is scheduled for her colonoscopy next week, I also want her to undergo a PET scan to ensure there is no occult evidence of disease metastasis.  Her PET scan will be scheduled for June 23rd.  I will see her back 1 day later to go over these images and the implications.  The patient understands all the plans discussed today and is in agreement with them.    Mindy Gould Felicia Horde, MD

## 2024-02-28 ENCOUNTER — Other Ambulatory Visit: Payer: Self-pay

## 2024-02-28 ENCOUNTER — Other Ambulatory Visit: Payer: Self-pay | Admitting: Oncology

## 2024-02-28 ENCOUNTER — Inpatient Hospital Stay (HOSPITAL_BASED_OUTPATIENT_CLINIC_OR_DEPARTMENT_OTHER): Admitting: Oncology

## 2024-02-28 VITALS — BP 145/88 | HR 86 | Temp 98.3°F | Resp 18 | Ht 64.5 in | Wt 87.0 lb

## 2024-02-28 DIAGNOSIS — Z85048 Personal history of other malignant neoplasm of rectum, rectosigmoid junction, and anus: Secondary | ICD-10-CM | POA: Diagnosis not present

## 2024-02-28 DIAGNOSIS — C188 Malignant neoplasm of overlapping sites of colon: Secondary | ICD-10-CM | POA: Diagnosis not present

## 2024-02-29 ENCOUNTER — Encounter: Payer: Self-pay | Admitting: Oncology

## 2024-02-29 ENCOUNTER — Other Ambulatory Visit: Payer: Self-pay

## 2024-03-07 ENCOUNTER — Other Ambulatory Visit: Payer: Self-pay

## 2024-03-10 ENCOUNTER — Other Ambulatory Visit (HOSPITAL_BASED_OUTPATIENT_CLINIC_OR_DEPARTMENT_OTHER): Admitting: Radiology

## 2024-03-11 ENCOUNTER — Ambulatory Visit: Admitting: Oncology

## 2024-03-17 ENCOUNTER — Telehealth: Payer: Self-pay | Admitting: Oncology

## 2024-03-17 NOTE — Telephone Encounter (Signed)
 03/17/24 Spoke with patient about PET SCAN scheduled on 03/26/24 arrive at 930am.Dr Lewis appt on 03/27/24 at 11am.

## 2024-03-26 NOTE — Progress Notes (Signed)
 [facility]  [address] [place],  [place]  [address] [phone]  Clinic Day:  03/27/2024  Referring physician: Burney Darice CROME, MD   HISTORY OF PRESENT ILLNESS:  The patient is a 68 y.o. 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  She completed 8 cycles of adjuvant single-agent Xeloda  In April 2024.   However, over these past months, there has been a gradual rise in her CEA level.  Based upon this, a PET scan was done to check for occult disease recurrence.  She comes in today to go over these images and their implications.   Since her last visit, the patient has been doing well.  She denies having any GI symptoms which concern her for overt signs of disease recurrence.  Of note, she did undergo a colonoscopy since her last visit, for which a few polyps were removed.  No other adverse lower GI tract pathology was appreciated.  PHYSICAL EXAM:  Blood pressure 97/75, pulse 77, temperature 98.2 F (36.8 C), temperature source Oral, resp. rate 20, height 5' 4.5 (1.638 m), weight 86 lb 12.8 oz (39.4 kg), SpO2 93%. Wt Readings from Last 3 Encounters:  03/27/24 86 lb 12.8 oz (39.4 kg)  02/28/24 87 lb (39.5 kg)  10/16/23 88 lb 4.8 oz (40.1 kg)   Body mass index is 14.67 kg/m. Performance status (ECOG): 1 - Symptomatic but completely ambulatory Physical Exam Constitutional:      Appearance: Normal appearance. She is not ill-appearing.     Comments: A very thin woman in no acute distress  HENT:     Mouth/Throat:     Mouth: Mucous membranes are moist.     Pharynx: Oropharynx is clear. No oropharyngeal exudate or posterior oropharyngeal erythema.  Cardiovascular:     Rate and Rhythm: Normal rate and regular rhythm.     Heart sounds: No murmur heard.    No friction rub. No gallop.  Pulmonary:     Effort: Pulmonary effort is normal. No respiratory distress.     Breath sounds: Normal breath  sounds. No wheezing, rhonchi or rales.  Abdominal:     General: Bowel sounds are normal. There is no distension.     Palpations: Abdomen is soft. There is no mass.     Tenderness: There is no abdominal tenderness.  Musculoskeletal:        General: No swelling.     Right lower leg: No edema.     Left lower leg: No edema.  Lymphadenopathy:     Cervical: No cervical adenopathy.     Upper Body:     Right upper body: No supraclavicular or axillary adenopathy.     Left upper body: No supraclavicular or axillary adenopathy.     Lower Body: No right inguinal adenopathy. No left inguinal adenopathy.  Skin:    General: Skin is warm.     Coloration: Skin is not jaundiced.     Findings: No lesion or rash.  Neurological:     General: No focal deficit present.     Mental Status: She is alert and oriented to person, place, and time. Mental status is at baseline.  Psychiatric:        Mood and Affect: Mood normal.        Behavior: Behavior normal.        Thought Content: Thought content normal.   SCANS:  Her PET scan on 03-26-24 revealed the following:  LABS:      Latest Ref Rng & Units 02/26/2024    1:46 PM 10/16/2023    1:18 PM 07/12/2023    2:15 PM  CBC  WBC 4.0 - 10.5 K/uL 11.1  9.0  8.3   Hemoglobin 12.0 - 15.0 g/dL 83.6  85.2  86.2   Hematocrit 36.0 - 46.0 % 51.3  44.8  43.5   Platelets 150 - 400 K/uL 219  241  279       Latest Ref Rng & Units 02/26/2024    1:46 PM 10/16/2023    1:18 PM 07/12/2023    2:15 PM  CMP  Glucose 70 - 99 mg/dL 862  96  81   BUN 8 - 23 mg/dL 12  12  14    Creatinine 0.44 - 1.00 mg/dL 9.25  9.34  9.37   Sodium 135 - 145 mmol/L 141  144  134   Potassium 3.5 - 5.1 mmol/L 4.1  3.9  4.6   Chloride 98 - 111 mmol/L 100  103  96   CO2 22 - 32 mmol/L 31  30  29    Calcium  8.9 - 10.3 mg/dL 9.8  9.9  9.3   Total Protein 6.5 - 8.1 g/dL 8.1  8.2  8.5   Total Bilirubin 0.0 - 1.2 mg/dL 0.3  0.3  0.3   Alkaline Phos 38 - 126 U/L 114  100  83   AST 15 - 41 U/L 24  21   19    ALT 0 - 44 U/L 16  11  16        Component Value Date/Time   CEA1 4.9 (H) 07/12/2023 1415   CEA 6.03 (H) 02/26/2024 1346   ASSESSMENT & PLAN:  Assessment/Plan:  A 68 y.o. female with stage IIIB rectal cancer, status post a low anterior resection in August 2023.  This was followed by 8 cycles of Xeloda , which were completed in April 2024.  Despite the gradual rise in her CEA level over these past visits, it does appear the patient is disease-free.  I did go over her PET scan images with her, which did not show any evidence of occult disease metastasis.  Furthermore, her colonoscopy did not reveal any intraluminal pathology.  Clinically, the patient appears to be doing well.  Based upon this, I will see her back in 4 months for repeat clinical assessment.  The patient understands all the plans discussed today and is in agreement with them.    Kyisha Fowle DELENA Kerns, MD

## 2024-03-27 ENCOUNTER — Inpatient Hospital Stay: Attending: Oncology | Admitting: Oncology

## 2024-03-27 ENCOUNTER — Other Ambulatory Visit: Payer: Self-pay | Admitting: Oncology

## 2024-03-27 ENCOUNTER — Other Ambulatory Visit: Payer: Self-pay

## 2024-03-27 VITALS — BP 97/75 | HR 77 | Temp 98.2°F | Resp 20 | Ht 64.5 in | Wt 86.8 lb

## 2024-03-27 DIAGNOSIS — C187 Malignant neoplasm of sigmoid colon: Secondary | ICD-10-CM

## 2024-03-27 DIAGNOSIS — Z85048 Personal history of other malignant neoplasm of rectum, rectosigmoid junction, and anus: Secondary | ICD-10-CM | POA: Insufficient documentation

## 2024-03-27 DIAGNOSIS — Z9221 Personal history of antineoplastic chemotherapy: Secondary | ICD-10-CM | POA: Insufficient documentation

## 2024-03-27 DIAGNOSIS — C182 Malignant neoplasm of ascending colon: Secondary | ICD-10-CM

## 2024-03-28 ENCOUNTER — Other Ambulatory Visit: Payer: Self-pay

## 2024-04-09 ENCOUNTER — Encounter: Payer: Self-pay | Admitting: Oncology

## 2024-04-11 ENCOUNTER — Encounter: Payer: Self-pay | Admitting: Oncology

## 2024-05-03 ENCOUNTER — Other Ambulatory Visit: Payer: Self-pay | Admitting: Cardiology

## 2024-07-25 ENCOUNTER — Inpatient Hospital Stay: Attending: Oncology

## 2024-07-25 DIAGNOSIS — R970 Elevated carcinoembryonic antigen [CEA]: Secondary | ICD-10-CM | POA: Insufficient documentation

## 2024-07-25 DIAGNOSIS — Z9221 Personal history of antineoplastic chemotherapy: Secondary | ICD-10-CM | POA: Insufficient documentation

## 2024-07-25 DIAGNOSIS — Z85048 Personal history of other malignant neoplasm of rectum, rectosigmoid junction, and anus: Secondary | ICD-10-CM | POA: Insufficient documentation

## 2024-07-25 DIAGNOSIS — C187 Malignant neoplasm of sigmoid colon: Secondary | ICD-10-CM

## 2024-07-25 LAB — CEA (ACCESS): CEA (CHCC): 5.19 ng/mL — ABNORMAL HIGH (ref 0.00–5.00)

## 2024-07-27 NOTE — Progress Notes (Deleted)
 [facility]  [address] [place],  [place]  [address] [phone]  Clinic Day:  03/27/2024  Referring physician: Burney Darice CROME, MD   HISTORY OF PRESENT ILLNESS:  The patient is a 68 y.o. 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  She completed 8 cycles of adjuvant single-agent Xeloda  In April 2024.   However, over these past months, there has been a gradual rise in her CEA level.  Based upon this, a PET scan was done to check for occult disease recurrence.  She comes in today to go over these images and their implications.   Since her last visit, the patient has been doing well.  She denies having any GI symptoms which concern her for overt signs of disease recurrence.  Of note, she did undergo a colonoscopy since her last visit, for which a few polyps were removed.  No other adverse lower GI tract pathology was appreciated.  PHYSICAL EXAM:  There were no vitals taken for this visit. Wt Readings from Last 3 Encounters:  03/27/24 86 lb 12.8 oz (39.4 kg)  02/28/24 87 lb (39.5 kg)  10/16/23 88 lb 4.8 oz (40.1 kg)   There is no height or weight on file to calculate BMI. Performance status (ECOG): 1 - Symptomatic but completely ambulatory Physical Exam Constitutional:      Appearance: Normal appearance. She is not ill-appearing.     Comments: A very thin woman in no acute distress  HENT:     Mouth/Throat:     Mouth: Mucous membranes are moist.     Pharynx: Oropharynx is clear. No oropharyngeal exudate or posterior oropharyngeal erythema.  Cardiovascular:     Rate and Rhythm: Normal rate and regular rhythm.     Heart sounds: No murmur heard.    No friction rub. No gallop.  Pulmonary:     Effort: Pulmonary effort is normal. No respiratory distress.     Breath sounds: Normal breath sounds. No wheezing, rhonchi or rales.  Abdominal:     General: Bowel sounds are normal. There is no  distension.     Palpations: Abdomen is soft. There is no mass.     Tenderness: There is no abdominal tenderness.  Musculoskeletal:        General: No swelling.     Right lower leg: No edema.     Left lower leg: No edema.  Lymphadenopathy:     Cervical: No cervical adenopathy.     Upper Body:     Right upper body: No supraclavicular or axillary adenopathy.     Left upper body: No supraclavicular or axillary adenopathy.     Lower Body: No right inguinal adenopathy. No left inguinal adenopathy.  Skin:    General: Skin is warm.     Coloration: Skin is not jaundiced.     Findings: No lesion or rash.  Neurological:     General: No focal deficit present.     Mental Status: She is alert and oriented to person, place, and time. Mental status is at baseline.  Psychiatric:        Mood and Affect: Mood normal.        Behavior: Behavior normal.        Thought Content: Thought content normal.   SCANS:  Her PET scan on 03-26-24 revealed the following:   LABS:      Latest Ref Rng & Units 02/26/2024  1:46 PM 10/16/2023    1:18 PM 07/12/2023    2:15 PM  CBC  WBC 4.0 - 10.5 K/uL 11.1  9.0  8.3   Hemoglobin 12.0 - 15.0 g/dL 83.6  85.2  86.2   Hematocrit 36.0 - 46.0 % 51.3  44.8  43.5   Platelets 150 - 400 K/uL 219  241  279       Latest Ref Rng & Units 02/26/2024    1:46 PM 10/16/2023    1:18 PM 07/12/2023    2:15 PM  CMP  Glucose 70 - 99 mg/dL 862  96  81   BUN 8 - 23 mg/dL 12  12  14    Creatinine 0.44 - 1.00 mg/dL 9.25  9.34  9.37   Sodium 135 - 145 mmol/L 141  144  134   Potassium 3.5 - 5.1 mmol/L 4.1  3.9  4.6   Chloride 98 - 111 mmol/L 100  103  96   CO2 22 - 32 mmol/L 31  30  29    Calcium  8.9 - 10.3 mg/dL 9.8  9.9  9.3   Total Protein 6.5 - 8.1 g/dL 8.1  8.2  8.5   Total Bilirubin 0.0 - 1.2 mg/dL 0.3  0.3  0.3   Alkaline Phos 38 - 126 U/L 114  100  83   AST 15 - 41 U/L 24  21  19    ALT 0 - 44 U/L 16  11  16      Latest Reference Range & Units 07/12/23 14:15 10/16/23 13:18  02/26/24 13:46 07/25/24 11:10  CEA 0.0 - 4.7 ng/mL 4.9 (H)     CEA ([facility]) 0.00 - 5.00 ng/mL  5.14 (H) 6.03 (H) 5.19 (H)  (H): Data is abnormally high  ASSESSMENT & PLAN:  Assessment/Plan:  A 68 y.o. female with stage IIIB rectal cancer, status post a low anterior resection in August 2023.  This was followed by 8 cycles of Xeloda , which were completed in April 2024.  Despite the gradual rise in her CEA level over these past visits, it does appear the patient is disease-free.  I did go over her PET scan images with her, which did not show any evidence of occult disease metastasis.  Furthermore, her colonoscopy did not reveal any intraluminal pathology.  Clinically, the patient appears to be doing well.  Based upon this, I will see her back in 4 months for repeat clinical assessment.  The patient understands all the plans discussed today and is in agreement with them.    Yehoshua Vitelli DELENA Kerns, MD

## 2024-07-28 ENCOUNTER — Telehealth: Payer: Self-pay | Admitting: Oncology

## 2024-07-28 ENCOUNTER — Inpatient Hospital Stay: Admitting: Oncology

## 2024-07-28 NOTE — Telephone Encounter (Signed)
Contacted pt to reschedule missed appt. Unable to reach via phone, voicemail was left.  

## 2024-07-29 ENCOUNTER — Other Ambulatory Visit: Payer: Self-pay | Admitting: Cardiology

## 2024-07-31 ENCOUNTER — Other Ambulatory Visit: Payer: Self-pay

## 2024-08-04 NOTE — Progress Notes (Unsigned)
 [facility]  [address] [place],  [place]  [address] [phone]  Clinic Day:  03/27/2024  Referring physician: Burney Darice CROME, MD   HISTORY OF PRESENT ILLNESS:  The patient is a 68 y.o. 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  She completed 8 cycles of adjuvant single-agent Xeloda  In April 2024.   However, over these past months, there has been a gradual rise in her CEA level.  Based upon this, a PET scan was done to check for occult disease recurrence.  She comes in today to go over these images and their implications.   Since her last visit, the patient has been doing well.  She denies having any GI symptoms which concern her for overt signs of disease recurrence.  Of note, she did undergo a colonoscopy since her last visit, for which a few polyps were removed.  No other adverse lower GI tract pathology was appreciated.  PHYSICAL EXAM:  There were no vitals taken for this visit. Wt Readings from Last 3 Encounters:  03/27/24 86 lb 12.8 oz (39.4 kg)  02/28/24 87 lb (39.5 kg)  10/16/23 88 lb 4.8 oz (40.1 kg)   There is no height or weight on file to calculate BMI. Performance status (ECOG): 1 - Symptomatic but completely ambulatory Physical Exam Constitutional:      Appearance: Normal appearance. She is not ill-appearing.     Comments: A very thin woman in no acute distress  HENT:     Mouth/Throat:     Mouth: Mucous membranes are moist.     Pharynx: Oropharynx is clear. No oropharyngeal exudate or posterior oropharyngeal erythema.  Cardiovascular:     Rate and Rhythm: Normal rate and regular rhythm.     Heart sounds: No murmur heard.    No friction rub. No gallop.  Pulmonary:     Effort: Pulmonary effort is normal. No respiratory distress.     Breath sounds: Normal breath sounds. No wheezing, rhonchi or rales.  Abdominal:     General: Bowel sounds are normal. There is no  distension.     Palpations: Abdomen is soft. There is no mass.     Tenderness: There is no abdominal tenderness.  Musculoskeletal:        General: No swelling.     Right lower leg: No edema.     Left lower leg: No edema.  Lymphadenopathy:     Cervical: No cervical adenopathy.     Upper Body:     Right upper body: No supraclavicular or axillary adenopathy.     Left upper body: No supraclavicular or axillary adenopathy.     Lower Body: No right inguinal adenopathy. No left inguinal adenopathy.  Skin:    General: Skin is warm.     Coloration: Skin is not jaundiced.     Findings: No lesion or rash.  Neurological:     General: No focal deficit present.     Mental Status: She is alert and oriented to person, place, and time. Mental status is at baseline.  Psychiatric:        Mood and Affect: Mood normal.        Behavior: Behavior normal.        Thought Content: Thought content normal.   SCANS:  Her PET scan on 03-26-24 revealed the following:   LABS:      Latest Ref Rng & Units 02/26/2024  1:46 PM 10/16/2023    1:18 PM 07/12/2023    2:15 PM  CBC  WBC 4.0 - 10.5 K/uL 11.1  9.0  8.3   Hemoglobin 12.0 - 15.0 g/dL 83.6  85.2  86.2   Hematocrit 36.0 - 46.0 % 51.3  44.8  43.5   Platelets 150 - 400 K/uL 219  241  279       Latest Ref Rng & Units 02/26/2024    1:46 PM 10/16/2023    1:18 PM 07/12/2023    2:15 PM  CMP  Glucose 70 - 99 mg/dL 862  96  81   BUN 8 - 23 mg/dL 12  12  14    Creatinine 0.44 - 1.00 mg/dL 9.25  9.34  9.37   Sodium 135 - 145 mmol/L 141  144  134   Potassium 3.5 - 5.1 mmol/L 4.1  3.9  4.6   Chloride 98 - 111 mmol/L 100  103  96   CO2 22 - 32 mmol/L 31  30  29    Calcium  8.9 - 10.3 mg/dL 9.8  9.9  9.3   Total Protein 6.5 - 8.1 g/dL 8.1  8.2  8.5   Total Bilirubin 0.0 - 1.2 mg/dL 0.3  0.3  0.3   Alkaline Phos 38 - 126 U/L 114  100  83   AST 15 - 41 U/L 24  21  19    ALT 0 - 44 U/L 16  11  16      Latest Reference Range & Units 07/12/23 14:15 10/16/23 13:18  02/26/24 13:46 07/25/24 11:10  CEA 0.0 - 4.7 ng/mL 4.9 (H)     CEA ([facility]) 0.00 - 5.00 ng/mL  5.14 (H) 6.03 (H) 5.19 (H)  (H): Data is abnormally high  ASSESSMENT & PLAN:  Assessment/Plan:  A 68 y.o. female with stage IIIB rectal cancer, status post a low anterior resection in August 2023.  This was followed by 8 cycles of Xeloda , which were completed in April 2024.  Despite the gradual rise in her CEA level over these past visits, it does appear the patient is disease-free.  I did go over her PET scan images with her, which did not show any evidence of occult disease metastasis.  Furthermore, her colonoscopy did not reveal any intraluminal pathology.  Clinically, the patient appears to be doing well.  Based upon this, I will see her back in 4 months for repeat clinical assessment.  The patient understands all the plans discussed today and is in agreement with them.    Yehoshua Vitelli DELENA Kerns, MD

## 2024-08-05 ENCOUNTER — Telehealth: Payer: Self-pay | Admitting: Oncology

## 2024-08-05 ENCOUNTER — Inpatient Hospital Stay: Admitting: Oncology

## 2024-08-05 ENCOUNTER — Other Ambulatory Visit: Payer: Self-pay | Admitting: Oncology

## 2024-08-05 DIAGNOSIS — C187 Malignant neoplasm of sigmoid colon: Secondary | ICD-10-CM

## 2024-08-05 NOTE — Telephone Encounter (Signed)
 Patient has been scheduled for follow-up visit per 08/04/24 LOS.  LVM notifying pt of appt details, provided my direct number to pt if appt changes need to be made.

## 2024-08-05 NOTE — Progress Notes (Signed)
 [facility] [address] [place],  [place]  [address] [phone]  Clinic Day:  08/05/2024  Referring physician: Burney Darice CROME, MD   HISTORY OF PRESENT ILLNESS:  The patient is a 68 y.o. 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  She completed 8 cycles of adjuvant single-agent Xeloda  In April 2024.  She comes in today for routine follow-up.  Since her last visit, the patient has been doing well. She denies having any GI symptoms which concern her for overt signs of disease recurrence.  In the recent past, there was a gradual rise in her CEA level.  Based upon this, a PET scan was done to check for occult disease recurrence.  Fortunately, this study came back negative.   PHYSICAL EXAM:  Blood pressure 124/81, pulse 79, temperature 97.8 F (36.6 C), temperature source Oral, resp. rate 16, height 5' 4.5 (1.638 m), weight 87 lb 12.8 oz (39.8 kg), SpO2 96%. Wt Readings from Last 3 Encounters:  08/05/24 87 lb 12.8 oz (39.8 kg)  03/27/24 86 lb 12.8 oz (39.4 kg)  02/28/24 87 lb (39.5 kg)   Body mass index is 14.84 kg/m. Performance status (ECOG): 1 - Symptomatic but completely ambulatory Physical Exam Constitutional:      Appearance: Normal appearance. She is not ill-appearing.     Comments: A very thin woman in no acute distress  HENT:     Mouth/Throat:     Mouth: Mucous membranes are moist.     Pharynx: Oropharynx is clear. No oropharyngeal exudate or posterior oropharyngeal erythema.  Cardiovascular:     Rate and Rhythm: Normal rate and regular rhythm.     Heart sounds: No murmur heard.    No friction rub. No gallop.  Pulmonary:     Effort: Pulmonary effort is normal. No respiratory distress.     Breath sounds: Normal breath sounds. No wheezing, rhonchi or rales.  Abdominal:     General: Bowel sounds are normal. There is no distension.     Palpations: Abdomen is soft. There is no  mass.     Tenderness: There is no abdominal tenderness.  Musculoskeletal:        General: No swelling.     Right lower leg: No edema.     Left lower leg: No edema.  Lymphadenopathy:     Cervical: No cervical adenopathy.     Upper Body:     Right upper body: No supraclavicular or axillary adenopathy.     Left upper body: No supraclavicular or axillary adenopathy.     Lower Body: No right inguinal adenopathy. No left inguinal adenopathy.  Skin:    General: Skin is warm.     Coloration: Skin is not jaundiced.     Findings: No lesion or rash.  Neurological:     General: No focal deficit present.     Mental Status: She is alert and oriented to person, place, and time. Mental status is at baseline.  Psychiatric:        Mood and Affect: Mood normal.        Behavior: Behavior normal.        Thought Content: Thought content normal.    LABS:      Latest Ref Rng & Units 02/26/2024    1:46 PM 10/16/2023    1:18 PM 07/12/2023    2:15 PM  CBC  WBC 4.0 - 10.5 K/uL 11.1  9.0  8.3   Hemoglobin 12.0 - 15.0 g/dL 83.6  85.2  86.2   Hematocrit 36.0 - 46.0 % 51.3  44.8  43.5   Platelets 150 - 400 K/uL 219  241  279       Latest Ref Rng & Units 02/26/2024    1:46 PM 10/16/2023    1:18 PM 07/12/2023    2:15 PM  CMP  Glucose 70 - 99 mg/dL 862  96  81   BUN 8 - 23 mg/dL 12  12  14    Creatinine 0.44 - 1.00 mg/dL 9.25  9.34  9.37   Sodium 135 - 145 mmol/L 141  144  134   Potassium 3.5 - 5.1 mmol/L 4.1  3.9  4.6   Chloride 98 - 111 mmol/L 100  103  96   CO2 22 - 32 mmol/L 31  30  29    Calcium  8.9 - 10.3 mg/dL 9.8  9.9  9.3   Total Protein 6.5 - 8.1 g/dL 8.1  8.2  8.5   Total Bilirubin 0.0 - 1.2 mg/dL 0.3  0.3  0.3   Alkaline Phos 38 - 126 U/L 114  100  83   AST 15 - 41 U/L 24  21  19    ALT 0 - 44 U/L 16  11  16     Review Flowsheet  More data exists      Latest Ref Rng & Units 10/16/2023 02/26/2024 07/25/2024  Oncology Labs  CEA ([facility]) 0.00 - 5.00 ng/mL 5.14  6.03  5.19     ASSESSMENT &  PLAN:  Assessment/Plan:  A 68 y.o. 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  This was also followed by 8 cycles of adjuvant oral Xeloda  chemotherapy.  Based upon her physical exam today and her recent CEA level, I do believe this patient remains disease-free.  Clinically, she continues to do very well.  Moving forward, I will start doing Signatera testing on her blood to ensure there is no early molecular evidence of disease recurrence.  This will be done, in conjunction with following her CEA level and physical exams, with respect to her rectal cancer surveillance.  I will see her back in 4 months for repeat clinical assessment.  The patient understands all the plans discussed today and is in agreement with them.    Mindy Gould DELENA Kerns, MD

## 2024-08-07 ENCOUNTER — Other Ambulatory Visit: Payer: Self-pay

## 2024-08-26 ENCOUNTER — Other Ambulatory Visit: Payer: Self-pay | Admitting: Cardiology

## 2024-09-18 DEATH — deceased

## 2024-10-15 ENCOUNTER — Other Ambulatory Visit (HOSPITAL_COMMUNITY): Payer: Self-pay

## 2024-12-02 ENCOUNTER — Inpatient Hospital Stay

## 2024-12-03 ENCOUNTER — Inpatient Hospital Stay: Admitting: Oncology
